# Patient Record
Sex: Female | Born: 1990 | Race: White | Hispanic: No | Marital: Single | Smoking: Never smoker
Health system: Southern US, Community
[De-identification: ages and names within clinical notes are randomized; demographics above are authoritative.]

## PROBLEM LIST (undated history)

## (undated) ENCOUNTER — Inpatient Hospital Stay (HOSPITAL_COMMUNITY): Payer: Self-pay

## (undated) DIAGNOSIS — T8859XA Other complications of anesthesia, initial encounter: Secondary | ICD-10-CM

## (undated) DIAGNOSIS — F444 Conversion disorder with motor symptom or deficit: Secondary | ICD-10-CM

## (undated) DIAGNOSIS — E039 Hypothyroidism, unspecified: Secondary | ICD-10-CM

## (undated) DIAGNOSIS — F419 Anxiety disorder, unspecified: Secondary | ICD-10-CM

## (undated) DIAGNOSIS — Z20828 Contact with and (suspected) exposure to other viral communicable diseases: Secondary | ICD-10-CM

## (undated) DIAGNOSIS — J45909 Unspecified asthma, uncomplicated: Secondary | ICD-10-CM

## (undated) DIAGNOSIS — Z789 Other specified health status: Secondary | ICD-10-CM

## (undated) DIAGNOSIS — E079 Disorder of thyroid, unspecified: Secondary | ICD-10-CM

## (undated) DIAGNOSIS — L309 Dermatitis, unspecified: Secondary | ICD-10-CM

## (undated) HISTORY — PX: WISDOM TOOTH EXTRACTION: SHX21

## (undated) HISTORY — PX: RHINOPLASTY: SUR1284

## (undated) HISTORY — DX: Hypothyroidism, unspecified: E03.9

## (undated) HISTORY — PX: OTHER SURGICAL HISTORY: SHX169

## (undated) HISTORY — DX: Dermatitis, unspecified: L30.9

## (undated) HISTORY — DX: Anxiety disorder, unspecified: F41.9

## (undated) HISTORY — PX: FRACTURE SURGERY: SHX138

## (undated) HISTORY — PX: TONSILLECTOMY: SUR1361

## (undated) HISTORY — DX: Other complications of anesthesia, initial encounter: T88.59XA

## (undated) HISTORY — DX: Conversion disorder with motor symptom or deficit: F44.4

---
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
## 2012-04-26 LAB — OB RESULTS CONSOLE ABO/RH

## 2012-04-26 LAB — OB RESULTS CONSOLE HIV ANTIBODY (ROUTINE TESTING): HIV: NONREACTIVE

## 2012-04-26 LAB — OB RESULTS CONSOLE GC/CHLAMYDIA: Gonorrhea: NEGATIVE

## 2012-08-23 NOTE — L&D Delivery Note (Signed)
Delivery Note Pt progressed to complete and pushed well.  At 4:38 PM a viable female was delivered via Vaginal, Spontaneous Delivery (Presentation: Middle Occiput Anterior).  APGAR: 8, 9; weight pending.   Placenta status: Intact, Spontaneous.  Cord: 3 vessels with the following complications: None.  Anesthesia: Epidural  Episiotomy: None Lacerations: 1st degree;Vaginal Suture Repair: 3.0 vicryl rapide Est. Blood Loss (mL): 300  Mom to postpartum.  Baby to stay with mom.  Sherlyn Ebbert D 11/25/2012, 5:27 PM

## 2012-08-29 ENCOUNTER — Encounter (HOSPITAL_COMMUNITY): Payer: Self-pay

## 2012-08-29 ENCOUNTER — Inpatient Hospital Stay (HOSPITAL_COMMUNITY)
Admission: AD | Admit: 2012-08-29 | Discharge: 2012-08-29 | Disposition: A | Discharge status: Home / Self Care | Payer: Medicaid Other | Source: Ambulatory Visit | Attending: Obstetrics and Gynecology | Admitting: Obstetrics and Gynecology

## 2012-08-29 DIAGNOSIS — W19XXXA Unspecified fall, initial encounter: Secondary | ICD-10-CM

## 2012-08-29 DIAGNOSIS — O99891 Other specified diseases and conditions complicating pregnancy: Secondary | ICD-10-CM | POA: Insufficient documentation

## 2012-08-29 DIAGNOSIS — W1809XA Striking against other object with subsequent fall, initial encounter: Secondary | ICD-10-CM | POA: Insufficient documentation

## 2012-08-29 DIAGNOSIS — M25569 Pain in unspecified knee: Secondary | ICD-10-CM | POA: Insufficient documentation

## 2012-08-29 HISTORY — DX: Disorder of thyroid, unspecified: E07.9

## 2012-08-29 MED ORDER — ACETAMINOPHEN 500 MG PO TABS
1000.0000 mg | ORAL_TABLET | Freq: Once | ORAL | Status: AC
  Administered 2012-08-29: 1000 mg via ORAL
  Filled 2012-08-29: qty 2

## 2012-08-29 NOTE — MAU Provider Note (Signed)
Chief Complaint:  Fall and Knee Pain   First Provider Initiated Contact with Patient 08/29/12 1309      HPI: Sandra Rich is a 22 y.o. G1P0 at [redacted]w[redacted]d who presents to maternity admissions reporting knee pain following a fall at work earlier today. The patientstates that she fell onto her right knee while at work today. She denies any injury to her abdomen. She denies contractions, leakage of fluid or vaginal bleeding. Reports good fetal movement.   Past Medical History: Past Medical History  Diagnosis Date  . 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  . Thyroid disease     Past obstetric history: OB History    Grav Para Term Preterm Abortions TAB SAB Ect Mult Living   1              # Outc Date GA Lbr Len/2nd Wgt Sex Del Anes PTL Lv   1 CUR               Past Surgical History: Past Surgical History  Procedure Date  . Rhinoplasty   . Wisdom teeth removal   . Fracture surgery     Family History: History reviewed. No pertinent family history.  Social History: History  Substance Use Topics  . Smoking status: Never Smoker   . Smokeless tobacco: Never Used  . Alcohol Use: No     Comment: occ    Allergies: No Known Allergies  Meds:  Prescriptions prior to admission  Medication Sig Dispense Refill  . ranitidine (ZANTAC) 75 MG tablet Take 75 mg by mouth daily as needed. For heartburn        ROS: Pertinent findings in history of present illness.  Physical Exam  Blood pressure 123/79, pulse 114, temperature 97.8 F (36.6 C), temperature source Oral, resp. rate 16, height 5' (1.524 m), weight 120 lb 4 oz (54.545 kg), last menstrual period 09/23/2011. GENERAL: Well-developed, well-nourished female in no acute distress.  HEENT: normocephalic HEART: normal rate RESP: normal effort ABDOMEN: Soft, non-tender, gravid appropriate for gestational age EXTREMITIES: right knee has 3 cm x 3 cm surface abrasion, light pink. No bleeding, debris noted. Tenderness to palpation over the  suprapatellar and patellar tendon region. No edema, or erythema.  NEURO: alert and oriented    FHT:  Baseline 140 , moderate variability, accelerations present, no decelerations Contractions: none   Labs: No results found for this or any previous visit (from the past 24 hour(s)).   MAU Course: Discussed patient with Dr. Ellyn Hack. She feels that since the patient did not hit the abdomen in the fall and is not having contractions, VB of LOF. We can monitor x 30 minutes and discharge with recommendations for pain control for her knee that are acceptable in pregnancy  Assessment: 1. Knee pain   2. Fall     Plan: Discharge home Labor precautions discussed RICE for knee pain. Patient given letter for work for today and tomorrow.  Patient to follow-up with [facility] OB[facility] as scheduled.  Patient may return to MAU as needed      Follow-up Information    Follow up with [facility],TODD D, MD. (As scheduled)    Contact information:   [address] [place] [address] [phone]           Medication List     As of 08/29/2012  2:31 PM    TAKE these medications         ranitidine 75 MG tablet  Commonly known as: ZANTAC   Take 75 mg by mouth daily as needed. For heartburn         Freddi Starr, PA-C 08/29/2012 2:31 PM

## 2012-08-29 NOTE — MAU Note (Signed)
Patient is in with c/o sore right knee. She states that her foot got caught on a cart at work about 12pm and feel onto her knees. She denies any abdominal pain, vaginal bleeding or lof. She reports good fetal movement. There is a 1.5cm reddened scratch on her right knee but no bruises noted. Her abdomen is soft and non tender.

## 2012-10-25 LAB — OB RESULTS CONSOLE GBS: GBS: NEGATIVE

## 2012-10-26 ENCOUNTER — Encounter (HOSPITAL_COMMUNITY): Payer: Self-pay | Admitting: *Deleted

## 2012-10-26 ENCOUNTER — Inpatient Hospital Stay (HOSPITAL_COMMUNITY)
Admission: AD | Admit: 2012-10-26 | Discharge: 2012-10-26 | Disposition: A | Discharge status: Home / Self Care | Payer: Medicaid Other | Source: Ambulatory Visit | Attending: Obstetrics and Gynecology | Admitting: Obstetrics and Gynecology

## 2012-10-26 DIAGNOSIS — E860 Dehydration: Secondary | ICD-10-CM | POA: Insufficient documentation

## 2012-10-26 DIAGNOSIS — A084 Viral intestinal infection, unspecified: Secondary | ICD-10-CM

## 2012-10-26 DIAGNOSIS — R197 Diarrhea, unspecified: Secondary | ICD-10-CM | POA: Insufficient documentation

## 2012-10-26 DIAGNOSIS — O212 Late vomiting of pregnancy: Secondary | ICD-10-CM | POA: Insufficient documentation

## 2012-10-26 DIAGNOSIS — A088 Other specified intestinal infections: Secondary | ICD-10-CM

## 2012-10-26 DIAGNOSIS — O99891 Other specified diseases and conditions complicating pregnancy: Secondary | ICD-10-CM | POA: Insufficient documentation

## 2012-10-26 HISTORY — DX: Other specified health status: Z78.9

## 2012-10-26 LAB — URINALYSIS, ROUTINE W REFLEX MICROSCOPIC
Bilirubin Urine: NEGATIVE
Glucose, UA: NEGATIVE mg/dL
Ketones, ur: >80 mg/dL — AB
Nitrite: NEGATIVE
Protein, ur: NEGATIVE mg/dL
Urobilinogen, UA: 0.2 mg/dL (ref 0.0–1.0)
pH: 5.5 (ref 5.0–8.0)

## 2012-10-26 LAB — URINE MICROSCOPIC-ADD ON

## 2012-10-26 LAB — COMPREHENSIVE METABOLIC PANEL
Alkaline Phosphatase: 264 U/L — ABNORMAL HIGH (ref 39–117)
BUN: 6 mg/dL (ref 6–23)
CO2: 21 mEq/L (ref 19–32)
Chloride: 100 mEq/L (ref 96–112)
GFR calc Af Amer: >90 mL/min (ref >90)
GFR calc non Af Amer: >90 mL/min (ref >90)
Glucose, Bld: 87 mg/dL (ref 70–99)
Potassium: 3.8 mEq/L (ref 3.5–5.1)
Total Bilirubin: 0.5 mg/dL (ref 0.3–1.2)

## 2012-10-26 LAB — CBC WITH DIFFERENTIAL/PLATELET
Basophils Absolute: 0 10*3/uL (ref 0.0–0.1)
Basophils Relative: 0 % (ref 0–1)
Lymphocytes Relative: 3 % — ABNORMAL LOW (ref 12–46)
Neutro Abs: 17.5 10*3/uL — ABNORMAL HIGH (ref 1.7–7.7)
Platelets: 286 10*3/uL (ref 150–400)
RDW: 15.2 % (ref 11.5–15.5)
WBC: 19.2 10*3/uL — ABNORMAL HIGH (ref 4.0–10.5)

## 2012-10-26 MED ORDER — PROMETHAZINE HCL 25 MG PO TABS: 12.5000 mg | ORAL_TABLET | Freq: Four times a day (QID) | ORAL | Status: DC | PRN

## 2012-10-26 MED ORDER — SODIUM CHLORIDE 0.9 % IV SOLN
25.0000 mg | Freq: Once | INTRAVENOUS | Status: AC
  Administered 2012-10-26: 25 mg via INTRAVENOUS
  Filled 2012-10-26: qty 1

## 2012-10-26 MED ORDER — LACTATED RINGERS IV SOLN: INTRAVENOUS | Status: DC

## 2012-10-26 NOTE — MAU Provider Note (Signed)
History     CSN: [id number]  Arrival date and time: 10/26/12 1851   First Provider Initiated Contact with Patient 10/26/12 1938      No chief complaint on file.  HPI This is a 22 y.o. G1P0 at [redacted]w[redacted]d who presents with history of vomiting since 1130 and development of diarrhea later. Husband was sick with this yesterday.  Denies fever or bleeding. Good fetal movement.   RN Note: PT SAYS AT 1130 AM THIS AM - SHE STARTED VOMITING- CALLED [facility]- THEY CALLED IN PHENERGAN SUPP- LAST USED AT 230PM. THEN SHE GOT DIARRHEA- THEN SHE WENT TO SLEEP- THEN AWOKE AND VOMITED AGAIN. SAYS UC STARTED AT 6PM. VE IN OFFICE -CLOSED. DENIES HSV AND MRSA.      OB History   Grav Para Term Preterm Abortions TAB SAB Ect Mult Living   1               Past Medical History  Diagnosis Date  . 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  . Thyroid disease   . Medical history non-contributory     Past Surgical History  Procedure Laterality Date  . Rhinoplasty    . Wisdom teeth removal    . Fracture surgery      History reviewed. No pertinent family history.  History  Substance Use Topics  . Smoking status: Never Smoker   . Smokeless tobacco: Never Used  . Alcohol Use: No     Comment: occ    Allergies: No Known Allergies  Prescriptions prior to admission  Medication Sig Dispense Refill  . acetaminophen (TYLENOL) 500 MG tablet Take 500 mg by mouth every 6 (six) hours as needed for pain.      . promethazine (PHENERGAN) 25 MG suppository Place 25 mg rectally every 6 (six) hours as needed for nausea.        Review of Systems  Constitutional: Positive for malaise/fatigue. Negative for fever and chills.  Gastrointestinal: Positive for nausea, vomiting, abdominal pain and diarrhea. Negative for constipation.  Genitourinary: Negative for dysuria.  Neurological: Positive for weakness. Negative for headaches.   Physical Exam   Blood pressure 122/85, pulse 121, temperature 98.6 F (37 C), temperature  source Oral, resp. rate 20, height 5\' 1"  (1.549 m), weight 128 lb 2 oz (58.117 kg), last menstrual period 09/23/2011.  Physical Exam  Constitutional: She is oriented to person, place, and time. She appears well-developed. No distress.  HENT:  Head: Normocephalic.  Cardiovascular: Regular rhythm and normal heart sounds.   Slight tachycardia  Respiratory: Effort normal and breath sounds normal. No respiratory distress. She has no wheezes. She has no rales. She exhibits no tenderness.  GI: Soft. She exhibits no distension and no mass. There is tenderness (diffuse, mild). There is no rebound and no guarding.  Musculoskeletal: Normal range of motion.  Neurological: She is alert and oriented to person, place, and time.  Skin: Skin is warm and dry.  Psychiatric: She has a normal mood and affect.    MAU Course  Procedures  MDM Will give IV with Phenergan then one more liter of LR 20:00 Reported MSE and orders that have begun for IV hydration.  Order given for CBC/diff and CMP.   20:30  Care turned over to [facility] and Plan  A:  SIUP at [redacted]w[redacted]d        Probable Norovirus P:  Hydration  Report to Lynder Parents, FNP  [facility] 10/26/2012, 7:45 PM   Care assumed from Jeani Sow, NP, at 2000.  FHR baseline 160s for brief period, and MHR 120s.  After 2000 ml IV fluid, FHR baseline 145 with accels, no decels, moderate variability Ctx Q2-3 minutes on toco, palpate mild, pt reports decrease in cramping while in MAU  Dilation: 1 Effacement (%): 60 Cervical Position: Posterior Station: -3 Presentation: Vertex Exam by:: L Leftwich-Kirby CNM A: 1. Viral gastroenteritis   2. Mild dehydration   Cervix unchanged/mild change in >2 hours in MAU  P: Called Dr Ambrose Mantle to discuss assessment and findings D/C home Labor precautions given Encouraged PO fluids PO phenergan prescribed--pt does not want to use suppositories she has at home F/U with prenatal  provider Return to MAU as needed  Sharen Counter Certified Nurse-Midwife

## 2012-10-26 NOTE — MAU Note (Signed)
PT SAYS  AT 1130 AM THIS AM  - SHE STARTED VOMITING- CALLED [facility]-  THEY CALLED IN  PHENERGAN SUPP-  LAST USED AT 230PM.   THEN SHE GOT DIARRHEA- THEN SHE WENT TO SLEEP- THEN AWOKE  AND VOMITED AGAIN. SAYS UC STARTED AT  6PM.     VE IN OFFICE -CLOSED.   DENIES HSV AND MRSA.

## 2012-10-28 LAB — URINE CULTURE: Colony Count: NO GROWTH

## 2012-11-23 ENCOUNTER — Telehealth (HOSPITAL_COMMUNITY): Payer: Self-pay | Admitting: *Deleted

## 2012-11-23 ENCOUNTER — Encounter (HOSPITAL_COMMUNITY): Payer: Self-pay | Admitting: *Deleted

## 2012-11-23 NOTE — Telephone Encounter (Signed)
Preadmission screen  

## 2012-11-25 ENCOUNTER — Encounter (HOSPITAL_COMMUNITY): Payer: Self-pay | Admitting: Anesthesiology

## 2012-11-25 ENCOUNTER — Inpatient Hospital Stay (HOSPITAL_COMMUNITY)
Admission: AD | Admit: 2012-11-25 | Discharge: 2012-11-27 | DRG: 775 | Disposition: A | Discharge status: Home / Self Care | Payer: Medicaid Other | Source: Ambulatory Visit | Attending: Obstetrics and Gynecology | Admitting: Obstetrics and Gynecology

## 2012-11-25 ENCOUNTER — Encounter (HOSPITAL_COMMUNITY): Payer: Self-pay | Admitting: *Deleted

## 2012-11-25 ENCOUNTER — Inpatient Hospital Stay (HOSPITAL_COMMUNITY): Payer: Medicaid Other | Admitting: Anesthesiology

## 2012-11-25 DIAGNOSIS — IMO0001 Reserved for inherently not codable concepts without codable children: Secondary | ICD-10-CM

## 2012-11-25 LAB — RPR: RPR Ser Ql: NONREACTIVE

## 2012-11-25 LAB — TYPE AND SCREEN
ABO/RH(D): B POS
Antibody Screen: NEGATIVE

## 2012-11-25 LAB — CBC
MCV: 68.4 fL — ABNORMAL LOW (ref 78.0–100.0)
Platelets: 300 10*3/uL (ref 150–400)
RBC: 4.14 MIL/uL (ref 3.87–5.11)
WBC: 17.4 10*3/uL — ABNORMAL HIGH (ref 4.0–10.5)

## 2012-11-25 MED ORDER — CITRIC ACID-SODIUM CITRATE 334-500 MG/5ML PO SOLN: 30.0000 mL | ORAL | Status: DC | PRN

## 2012-11-25 MED ORDER — SIMETHICONE 80 MG PO CHEW: 80.0000 mg | CHEWABLE_TABLET | ORAL | Status: DC | PRN

## 2012-11-25 MED ORDER — OXYTOCIN 40 UNITS IN LACTATED RINGERS INFUSION - SIMPLE MED
62.5000 mL/h | INTRAVENOUS | Status: DC
  Filled 2012-11-25: qty 1000

## 2012-11-25 MED ORDER — MAGNESIUM HYDROXIDE 400 MG/5ML PO SUSP: 30.0000 mL | ORAL | Status: DC | PRN

## 2012-11-25 MED ORDER — BUTORPHANOL TARTRATE 1 MG/ML IJ SOLN
1.0000 mg | INTRAMUSCULAR | Status: DC | PRN
  Filled 2012-11-25: qty 1

## 2012-11-25 MED ORDER — OXYCODONE-ACETAMINOPHEN 5-325 MG PO TABS
1.0000 | ORAL_TABLET | ORAL | Status: DC | PRN
  Administered 2012-11-26 – 2012-11-27 (×2): 1 via ORAL
  Administered 2012-11-27: 2 via ORAL
  Filled 2012-11-25 (×2): qty 1
  Filled 2012-11-25: qty 2

## 2012-11-25 MED ORDER — FENTANYL 2.5 MCG/ML BUPIVACAINE 1/10 % EPIDURAL INFUSION (WH - ANES)
14.0000 mL/h | INTRAMUSCULAR | Status: DC | PRN
  Administered 2012-11-25 (×2): 14 mL/h via EPIDURAL
  Filled 2012-11-25 (×3): qty 125

## 2012-11-25 MED ORDER — METHYLERGONOVINE MALEATE 0.2 MG PO TABS: 0.2000 mg | ORAL_TABLET | ORAL | Status: DC | PRN

## 2012-11-25 MED ORDER — OXYTOCIN 40 UNITS IN LACTATED RINGERS INFUSION - SIMPLE MED
1.0000 m[IU]/min | INTRAVENOUS | Status: DC
  Administered 2012-11-25: 1 m[IU]/min via INTRAVENOUS

## 2012-11-25 MED ORDER — ONDANSETRON HCL 4 MG PO TABS: 4.0000 mg | ORAL_TABLET | ORAL | Status: DC | PRN

## 2012-11-25 MED ORDER — EPHEDRINE 5 MG/ML INJ
10.0000 mg | INTRAVENOUS | Status: DC | PRN
  Filled 2012-11-25: qty 2

## 2012-11-25 MED ORDER — OXYTOCIN BOLUS FROM INFUSION
500.0000 mL | INTRAVENOUS | Status: DC
  Administered 2012-11-25: 500 mL via INTRAVENOUS

## 2012-11-25 MED ORDER — SENNOSIDES-DOCUSATE SODIUM 8.6-50 MG PO TABS
2.0000 | ORAL_TABLET | Freq: Every day | ORAL | Status: DC
  Administered 2012-11-25 – 2012-11-26 (×2): 2 via ORAL

## 2012-11-25 MED ORDER — MEASLES, MUMPS & RUBELLA VAC ~~LOC~~ INJ: 0.5000 mL | INJECTION | Freq: Once | SUBCUTANEOUS | Status: DC

## 2012-11-25 MED ORDER — DIPHENHYDRAMINE HCL 50 MG/ML IJ SOLN
12.5000 mg | INTRAMUSCULAR | Status: DC | PRN
  Administered 2012-11-25: 12.5 mg via INTRAVENOUS
  Filled 2012-11-25: qty 1

## 2012-11-25 MED ORDER — TETANUS-DIPHTH-ACELL PERTUSSIS 5-2.5-18.5 LF-MCG/0.5 IM SUSP: 0.5000 mL | Freq: Once | INTRAMUSCULAR | Status: DC

## 2012-11-25 MED ORDER — PRENATAL MULTIVITAMIN CH
1.0000 | ORAL_TABLET | Freq: Every day | ORAL | Status: DC
  Administered 2012-11-26 – 2012-11-27 (×2): 1 via ORAL
  Filled 2012-11-25 (×2): qty 1

## 2012-11-25 MED ORDER — BENZOCAINE-MENTHOL 20-0.5 % EX AERO
1.0000 "application " | INHALATION_SPRAY | CUTANEOUS | Status: DC | PRN
  Administered 2012-11-27: 1 via TOPICAL
  Filled 2012-11-25: qty 56

## 2012-11-25 MED ORDER — LIDOCAINE HCL (PF) 1 % IJ SOLN
30.0000 mL | INTRAMUSCULAR | Status: DC | PRN
  Filled 2012-11-25 (×2): qty 30

## 2012-11-25 MED ORDER — IBUPROFEN 600 MG PO TABS: 600.0000 mg | ORAL_TABLET | Freq: Four times a day (QID) | ORAL | Status: DC | PRN

## 2012-11-25 MED ORDER — WITCH HAZEL-GLYCERIN EX PADS: 1.0000 "application " | MEDICATED_PAD | CUTANEOUS | Status: DC | PRN

## 2012-11-25 MED ORDER — ACETAMINOPHEN 325 MG PO TABS: 650.0000 mg | ORAL_TABLET | ORAL | Status: DC | PRN

## 2012-11-25 MED ORDER — OXYCODONE-ACETAMINOPHEN 5-325 MG PO TABS: 1.0000 | ORAL_TABLET | ORAL | Status: DC | PRN

## 2012-11-25 MED ORDER — FLEET ENEMA 7-19 GM/118ML RE ENEM: 1.0000 | ENEMA | RECTAL | Status: DC | PRN

## 2012-11-25 MED ORDER — LACTATED RINGERS IV SOLN: 500.0000 mL | INTRAVENOUS | Status: DC | PRN

## 2012-11-25 MED ORDER — PHENYLEPHRINE 40 MCG/ML (10ML) SYRINGE FOR IV PUSH (FOR BLOOD PRESSURE SUPPORT)
80.0000 ug | PREFILLED_SYRINGE | INTRAVENOUS | Status: DC | PRN
  Filled 2012-11-25: qty 2
  Filled 2012-11-25 (×2): qty 5

## 2012-11-25 MED ORDER — METHYLERGONOVINE MALEATE 0.2 MG/ML IJ SOLN: 0.2000 mg | INTRAMUSCULAR | Status: DC | PRN

## 2012-11-25 MED ORDER — LANOLIN HYDROUS EX OINT: TOPICAL_OINTMENT | CUTANEOUS | Status: DC | PRN

## 2012-11-25 MED ORDER — PROMETHAZINE HCL 25 MG/ML IJ SOLN
12.5000 mg | Freq: Once | INTRAMUSCULAR | Status: DC
  Filled 2012-11-25: qty 1

## 2012-11-25 MED ORDER — LACTATED RINGERS IV SOLN
INTRAVENOUS | Status: DC
  Administered 2012-11-25: 125 mL/h via INTRAVENOUS
  Administered 2012-11-25: 04:00:00 via INTRAVENOUS

## 2012-11-25 MED ORDER — EPHEDRINE 5 MG/ML INJ
10.0000 mg | INTRAVENOUS | Status: DC | PRN
  Filled 2012-11-25 (×2): qty 4
  Filled 2012-11-25: qty 2

## 2012-11-25 MED ORDER — TERBUTALINE SULFATE 1 MG/ML IJ SOLN: 0.2500 mg | Freq: Once | INTRAMUSCULAR | Status: DC | PRN

## 2012-11-25 MED ORDER — PHENYLEPHRINE 40 MCG/ML (10ML) SYRINGE FOR IV PUSH (FOR BLOOD PRESSURE SUPPORT)
80.0000 ug | PREFILLED_SYRINGE | INTRAVENOUS | Status: DC | PRN
  Filled 2012-11-25: qty 2

## 2012-11-25 MED ORDER — LIDOCAINE HCL (PF) 1 % IJ SOLN
INTRAMUSCULAR | Status: DC | PRN
  Administered 2012-11-25 (×4): 4 mL

## 2012-11-25 MED ORDER — DIPHENHYDRAMINE HCL 25 MG PO CAPS: 25.0000 mg | ORAL_CAPSULE | Freq: Four times a day (QID) | ORAL | Status: DC | PRN

## 2012-11-25 MED ORDER — IBUPROFEN 600 MG PO TABS
600.0000 mg | ORAL_TABLET | Freq: Four times a day (QID) | ORAL | Status: DC
  Administered 2012-11-25 – 2012-11-27 (×7): 600 mg via ORAL
  Filled 2012-11-25 (×7): qty 1

## 2012-11-25 MED ORDER — LACTATED RINGERS IV SOLN
500.0000 mL | Freq: Once | INTRAVENOUS | Status: AC
  Administered 2012-11-25: 500 mL via INTRAVENOUS

## 2012-11-25 MED ORDER — ONDANSETRON HCL 4 MG/2ML IJ SOLN
4.0000 mg | Freq: Four times a day (QID) | INTRAMUSCULAR | Status: DC | PRN
  Administered 2012-11-25: 4 mg via INTRAVENOUS
  Filled 2012-11-25: qty 2

## 2012-11-25 MED ORDER — DIBUCAINE 1 % RE OINT: 1.0000 "application " | TOPICAL_OINTMENT | RECTAL | Status: DC | PRN

## 2012-11-25 MED ORDER — ONDANSETRON HCL 4 MG/2ML IJ SOLN: 4.0000 mg | INTRAMUSCULAR | Status: DC | PRN

## 2012-11-25 MED ORDER — ZOLPIDEM TARTRATE 5 MG PO TABS: 5.0000 mg | ORAL_TABLET | Freq: Every evening | ORAL | Status: DC | PRN

## 2012-11-25 NOTE — Anesthesia Procedure Notes (Signed)
Epidural Patient location during procedure: OB Start time: 11/25/2012 4:48 AM  Staffing Performed by: anesthesiologist   Preanesthetic Checklist Completed: patient identified, site marked, surgical consent, pre-op evaluation, timeout performed, IV checked, risks and benefits discussed and monitors and equipment checked  Epidural Patient position: sitting Prep: site prepped and draped and DuraPrep Patient monitoring: continuous pulse ox and blood pressure Approach: midline Injection technique: LOR air  Needle:  Needle type: Tuohy  Needle gauge: 17 G Needle length: 9 cm and 9 Needle insertion depth: 4 cm Catheter type: closed end flexible Catheter size: 19 Gauge Catheter at skin depth: 9 cm Test dose: negative  Assessment Events: blood not aspirated, injection not painful, no injection resistance, negative IV test and no paresthesia  Additional Notes Discussed risk of headache, infection, bleeding, nerve injury and failed or incomplete block.  Patient voices understanding and wishes to proceed.  Epidural placed easily on first attempt.  No paresthesia.  Patient tolerated procedure well with no apparent complications.  Jasmine December, MDReason for block:procedure for pain

## 2012-11-25 NOTE — Progress Notes (Signed)
Pt moving toco after rn adjusted toco

## 2012-11-25 NOTE — Progress Notes (Signed)
Dr Jackelyn Knife asked pt to not push until his other pt delivers

## 2012-11-25 NOTE — H&P (Signed)
Sandra Rich is a 21 y.o. female, G1P0, EGA [redacted] weeks with EDC 4-3 presenting for evaluation of reg ctx.  VE in MAU 4 cm dilated.  Pt has been admitted and received an epidural.  Prenatal care essentially uncomplicated, occasional FHR irregularity, see prenatal records for complete history.  Maternal Medical History:  Reason for admission: Contractions.   Contractions: Frequency: regular.   Perceived severity is strong.    Fetal activity: Perceived fetal activity is normal.    Prenatal complications: no prenatal complications   OB History   Grav Para Term Preterm Abortions TAB SAB Ect Mult Living   1              Past Medical History  Diagnosis Date  . 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  . Thyroid disease     hypo  . Medical history non-contributory   . Eczema    Past Surgical History  Procedure Laterality Date  . Rhinoplasty    . Wisdom teeth removal    . Fracture surgery     Family History: family history includes Depression in her brother. Social History:  reports that she has never smoked. She has never used smokeless tobacco. She reports that she does not drink alcohol or use illicit drugs.   Prenatal Transfer Tool  Maternal Diabetes: No Genetic Screening: Normal Maternal Ultrasounds/Referrals: Normal Fetal Ultrasounds or other Referrals:  None Maternal Substance Abuse:  No Significant Maternal Medications:  None Significant Maternal Lab Results:  Lab values include: Group B Strep negative Other Comments:  Occasional FHR irregularity  Review of Systems  Respiratory: Negative.   Cardiovascular: Negative.    AROM- clear Dilation: 6 Effacement (%): 90 Station: 0 Exam by:: Dr. Jackelyn Knife Blood pressure 113/77, pulse 110, temperature 98.2 F (36.8 C), temperature source Oral, resp. rate 18, height 5\' 1"  (1.549 m), weight 58.514 kg (129 lb), last menstrual period 09/23/2011, SpO2 100.00%. Maternal Exam:  Uterine Assessment: Contraction strength is moderate.   Contraction frequency is regular.   Abdomen: Patient reports no abdominal tenderness. Estimated fetal weight is 7 lbs.   Fetal presentation: vertex  Introitus: Normal vulva. Normal vagina.  Amniotic fluid character: clear.  Pelvis: adequate for delivery.   Cervix: Cervix evaluated by digital exam.     Fetal Exam Fetal Monitor Review: Mode: ultrasound.   Baseline rate: 120.  Variability: moderate (6-25 bpm).   Pattern: accelerations present and no decelerations.    Fetal State Assessment: Category I - tracings are normal.     Physical Exam  Constitutional: She appears well-developed and well-nourished.  Cardiovascular: Normal rate, regular rhythm and normal heart sounds.   No murmur heard. Respiratory: Effort normal and breath sounds normal. No respiratory distress.  GI: Soft.  gravid  Genitourinary:  Probable condylomata on labia    Prenatal labs: ABO, Rh: --/--/B POS (04/05 0340) Antibody: PENDING (04/05 0340) Rubella: Immune (09/04 0000) RPR: Nonreactive (09/04 0000)  HBsAg: Negative (09/04 0000)  HIV: Non-reactive (09/04 0000)  GBS: Negative (03/05 0000)  GCT:  118  Assessment/Plan: IUP at 40+ weeks in active labor.  AROM done for augmentation, monitor progress anticipate SVD.   Sandra Rich D 11/25/2012, 5:36 AM

## 2012-11-25 NOTE — Progress Notes (Signed)
Pt had turned to back and repositioned on left side

## 2012-11-25 NOTE — Anesthesia Preprocedure Evaluation (Signed)
Anesthesia Evaluation  Patient identified by MRN, date of birth, ID band Patient awake    Reviewed: Allergy & Precautions, H&P , NPO status , Patient's Chart, lab work & pertinent test results, reviewed documented beta blocker date and time   History of Anesthesia Complications Negative for: history of anesthetic complications  Airway Mallampati: I TM Distance: >3 FB Neck ROM: full    Dental  (+) Teeth Intact   Pulmonary neg pulmonary ROS,  breath sounds clear to auscultation        Cardiovascular negative cardio ROS  Rhythm:regular Rate:Normal     Neuro/Psych negative neurological ROS  negative psych ROS   GI/Hepatic negative GI ROS, Neg liver ROS,   Endo/Other  negative endocrine ROS  Renal/GU negative Renal ROS     Musculoskeletal   Abdominal   Peds  Hematology negative hematology ROS (+) anemia ,   Anesthesia Other Findings Morphine allergy - hives  Reproductive/Obstetrics (+) Pregnancy                           Anesthesia Physical Anesthesia Plan  ASA: II  Anesthesia Plan: Epidural   Post-op Pain Management:    Induction:   Airway Management Planned:   Additional Equipment:   Intra-op Plan:   Post-operative Plan:   Informed Consent: I have reviewed the patients History and Physical, chart, labs and discussed the procedure including the risks, benefits and alternatives for the proposed anesthesia with the patient or authorized representative who has indicated his/her understanding and acceptance.     Plan Discussed with:   Anesthesia Plan Comments:         Anesthesia Quick Evaluation

## 2012-11-26 LAB — CBC
HCT: 24.6 % — ABNORMAL LOW (ref 36.0–46.0)
Hemoglobin: 7.6 g/dL — ABNORMAL LOW (ref 12.0–15.0)
MCH: 21.2 pg — ABNORMAL LOW (ref 26.0–34.0)
MCHC: 30.9 g/dL (ref 30.0–36.0)
MCV: 68.5 fL — ABNORMAL LOW (ref 78.0–100.0)

## 2012-11-26 NOTE — Progress Notes (Addendum)
PPD #1 No problems Afeb, VSS Fundus firm, NT at U-1 Hgb 8.7 to 7.6 Continue routine postpartum care

## 2012-11-26 NOTE — Lactation Note (Signed)
This note was copied from the chart of Sandra Rich. Lactation Consultation Note  Patient Name: Sandra Deshonna Trnka FAOZH'Y Date: 11/26/2012 Reason for consult: Initial assessment LC in to assist mother with feeding . Baby has been latching shallow and causing discomfort to mother 's nipples. Taught suck training exercises and gum massage. Baby was able to latch with minimal assistance to the right breast in a cross cradle hold. Reviewed latch and postioning, feeding cues and listening for swallows. Mother was engaged with LC at times. She was talking to her friend and mother regarding social situation with FOB and she would frequently talk about being so sleepy after pain med. Teaching will need to be reinforced. Taught hand expression and mother has lots of colostrum. [facility] handout given with resources for support after discharge. Patients mother is very supportive of breastfeeding. Patient was having uterine cramping during the feeding.  Maternal Data Formula Feeding for Exclusion: No Infant to breast within first hour of birth: Yes Has patient been taught Hand Expression?: Yes Does the patient have breastfeeding experience prior to this delivery?: No  Feeding Feeding Type: Breast Milk Feeding method: Breast Length of feed: 5 min  LATCH Score/Interventions Latch: Repeated attempts needed to sustain latch, nipple held in mouth throughout feeding, stimulation needed to elicit sucking reflex. Intervention(s): Assist with latch;Breast compression  Audible Swallowing: A few with stimulation Intervention(s): Skin to skin  Type of Nipple: Everted at rest and after stimulation  Comfort (Breast/Nipple): Soft / non-tender     Hold (Positioning): Assistance needed to correctly position infant at breast and maintain latch. Intervention(s): Breastfeeding basics reviewed;Skin to skin;Support Pillows  LATCH Score: 7  Lactation Tools Discussed/Used     Consult Status Consult  Status: Follow-up Date: 11/27/12 Follow-up type: In-patient    Christella Hartigan M 11/26/2012, 6:49 PM

## 2012-11-26 NOTE — Anesthesia Postprocedure Evaluation (Signed)
  Anesthesia Post-op Note  Patient: Sandra Rich  Procedure(s) Performed: * No procedures listed *  Patient Location: Mother/Baby  Anesthesia Type:Epidural  Level of Consciousness: awake, alert , oriented and patient cooperative  Airway and Oxygen Therapy: Patient Spontanous Breathing  Post-op Pain: mild  Post-op Assessment: Patient's Cardiovascular Status Stable, Respiratory Function Stable, No signs of Nausea or vomiting, Adequate PO intake and Pain level controlled  Post-op Vital Signs: Reviewed and stable  Complications: No apparent anesthesia complications

## 2012-11-27 MED ORDER — OXYCODONE-ACETAMINOPHEN 5-325 MG PO TABS: 1.0000 | ORAL_TABLET | ORAL | Status: DC | PRN

## 2012-11-27 MED ORDER — IBUPROFEN 600 MG PO TABS: 600.0000 mg | ORAL_TABLET | Freq: Four times a day (QID) | ORAL | Status: DC

## 2012-11-27 NOTE — Progress Notes (Signed)
PPD #2 No problems Afeb, VSS Fundus firm, NT at U-1 D/c home 

## 2012-11-27 NOTE — Progress Notes (Signed)
UR chart review completed.  

## 2012-11-27 NOTE — Discharge Summary (Signed)
Obstetric Discharge Summary Reason for Admission: onset of labor Prenatal Procedures: none Intrapartum Procedures: spontaneous vaginal delivery Postpartum Procedures: none Complications-Operative and Postpartum: 1st degree perineal laceration Hemoglobin  Date Value Range Status  11/26/2012 7.6* 12.0 - 15.0 g/dL Final     HCT  Date Value Range Status  11/26/2012 24.6* 36.0 - 46.0 % Final    Physical Exam:  General: alert Lochia: appropriate Uterine Fundus: firm  Discharge Diagnoses: Term Pregnancy-delivered  Discharge Information: Date: 11/27/2012 Activity: pelvic rest Diet: routine Medications: Ibuprofen and Percocet Condition: stable Instructions: refer to practice specific booklet Discharge to: home Follow-up Information   Follow up with Breydan Shillingburg D, MD. Schedule an appointment as soon as possible for a visit in 6 weeks.   Contact information:   [address] [place] [address] [phone]       Newborn Data: Live born female  Birth Weight: 8 lb 3 oz (3714 g) APGAR: 8, 9  Home with mother.  Alek Poncedeleon D 11/27/2012, 10:09 AM

## 2012-11-30 ENCOUNTER — Inpatient Hospital Stay (HOSPITAL_COMMUNITY): Admission: RE | Admit: 2012-11-30 | Payer: Medicaid Other | Source: Ambulatory Visit

## 2014-06-24 ENCOUNTER — Encounter (HOSPITAL_COMMUNITY): Payer: Self-pay | Admitting: *Deleted

## 2015-10-08 ENCOUNTER — Emergency Department (HOSPITAL_COMMUNITY)
Admission: EM | Admit: 2015-10-08 | Discharge: 2015-10-08 | Disposition: A | Discharge status: Home / Self Care | Payer: Medicaid Other | Attending: Emergency Medicine | Admitting: Emergency Medicine

## 2015-10-08 ENCOUNTER — Encounter (HOSPITAL_COMMUNITY): Payer: Self-pay | Admitting: *Deleted

## 2015-10-08 DIAGNOSIS — R0981 Nasal congestion: Secondary | ICD-10-CM

## 2015-10-08 DIAGNOSIS — Z872 Personal history of diseases of the skin and subcutaneous tissue: Secondary | ICD-10-CM | POA: Insufficient documentation

## 2015-10-08 DIAGNOSIS — Z8639 Personal history of other endocrine, nutritional and metabolic disease: Secondary | ICD-10-CM | POA: Insufficient documentation

## 2015-10-08 DIAGNOSIS — J029 Acute pharyngitis, unspecified: Secondary | ICD-10-CM | POA: Insufficient documentation

## 2015-10-08 DIAGNOSIS — Z791 Long term (current) use of non-steroidal anti-inflammatories (NSAID): Secondary | ICD-10-CM | POA: Insufficient documentation

## 2015-10-08 DIAGNOSIS — J3489 Other specified disorders of nose and nasal sinuses: Secondary | ICD-10-CM | POA: Insufficient documentation

## 2015-10-08 MED ORDER — FLUTICASONE PROPIONATE 50 MCG/ACT NA SUSP: 2.0000 | Freq: Every day | NASAL | Status: DC

## 2015-10-08 NOTE — ED Provider Notes (Signed)
CSN: [id number]     Arrival date & time 10/08/15  1622 History  By signing my name below, I, Soijett Blue, attest that this documentation has been prepared under the direction and in the presence of Fayrene Helper, PA-C Electronically Signed: Soijett Blue, ED Scribe. 10/08/2015. 4:54 PM.   Chief Complaint  Patient presents with  . Facial Pain      The history is provided by the patient. No language interpreter was used.    Sandra Rich is a 25 y.o. female who presents to the Emergency Department complaining of sinus pressure onset 5 days ago. She notes that she tried to make an appointment with her PCP with no success. pt denies any environmental changes/foods/soaps at this time. She states that she is having associated symptoms of nasal congestion, chest congestion, chills, sneezing, sore throat x 2 days that has resolved, and fever that has since resolved. She states that she has tried tylenol and alka seltzer cold plus with no relief for her symptoms. She denies n/v/d, and any other symptoms.  Denies PMHx of asthma or any seasonal allergies.   Past Medical History  Diagnosis Date  . 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  . Thyroid disease     hypo  . Medical history non-contributory   . Eczema    Past Surgical History  Procedure Laterality Date  . Rhinoplasty    . Wisdom teeth removal    . Fracture surgery     Family History  Problem Relation Age of Onset  . Depression Brother    Social History  Substance Use Topics  . Smoking status: Never Smoker   . Smokeless tobacco: Never Used  . Alcohol Use: No     Comment: occ   OB History    Gravida Para Term Preterm AB TAB SAB Ectopic Multiple Living   Review of Systems  Constitutional: Positive for fever and chills.  HENT: Positive for congestion, sinus pressure, sneezing and sore throat.   Gastrointestinal: Negative for nausea, vomiting and diarrhea.      Allergies  Morphine and related  Home  Medications   Prior to Admission medications   Medication Sig Start Date End Date Taking? Authorizing Provider  ibuprofen (ADVIL,MOTRIN) 600 MG tablet Take 1 tablet (600 mg total) by mouth every 6 (six) hours. 11/27/12   Lavina Hamman, MD  oxyCODONE-acetaminophen (PERCOCET/ROXICET) 5-325 MG per tablet Take 1-2 tablets by mouth every 4 (four) hours as needed. 11/27/12   Lavina Hamman, MD  promethazine (PHENERGAN) 25 MG tablet Take 0.5-1 tablets (12.5-25 mg total) by mouth every 6 (six) hours as needed for nausea. 10/26/12   Misty Stanley A Leftwich-Kirby, CNM   BP 126/89 mmHg  Pulse 77  Temp(Src) 97.5 F (36.4 C) (Oral)  Resp 14  SpO2 100% Physical Exam   Physical Exam  Constitutional: Pt  is oriented to person, place, and time. Appears well-developed and well-nourished. No distress.  HENT:  Head: Normocephalic and atraumatic.  Right Ear: Tympanic membrane, external ear and ear canal normal.  Left Ear: Tympanic membrane, external ear and ear canal normal.  Nose: Mucosal edema and mild rhinorrhea present. No epistaxis. Pt exhibits mild frontal sinus tenderness and no maxillary sinus tenderness. Left sinus exhibits no maxillary sinus tenderness and no frontal sinus tenderness.  Mouth/Throat: Uvula is midline and mucous membranes are normal. Mucous membranes are not pale and not cyanotic. No oropharyngeal exudate, posterior  oropharyngeal edema, posterior oropharyngeal erythema or tonsillar abscesses.  Eyes: Conjunctivae are normal. Pupils are equal, round, and reactive to light.  Neck: Normal range of motion and full passive range of motion without pain.  Cardiovascular: Normal rate and intact distal pulses.   Pulmonary/Chest: Effort normal and breath sounds normal. No stridor.  Clear and equal breath sounds without focal wheezes, rhonchi, rales  Abdominal: Soft. Bowel sounds are normal. There is no tenderness.  Musculoskeletal: Normal range of motion.  Lymphadenopathy:    Pthas no cervical  adenopathy.  Neurological: Pt is alert and oriented to person, place, and time.  Skin: Skin is warm and dry. No rash noted. Pt is not diaphoretic.  Psychiatric: Normal mood and affect.  Nursing note and vitals reviewed.    ED Course  Procedures (including critical care time) DIAGNOSTIC STUDIES: Oxygen Saturation is 100% on RA, nl by my interpretation.    COORDINATION OF CARE: 4:52 PM Discussed treatment plan with pt at bedside which includes flonase Rx and pt agreed to plan.    MDM   Final diagnoses:  None   I suspect that this is a probable seasonal allergies as the source of the sinus congestion, although common URI presents in similar fashion. Low suspicion for pneumonia, bacterial infection, sinusitis, or strep throat at this time. Will treat the pt with flonase Rx and symptomatic treatment. Discussed return precautions.  Pt is hemodynamically stable & in NAD prior to discharge.  I personally performed the services described in this documentation, which was scribed in my presence. The recorded information has been reviewed and is accurate.      Fayrene Helper, PA-C 10/08/15 1656  Glynn Octave, MD 10/08/15 [phone]

## 2015-10-08 NOTE — Discharge Instructions (Signed)
Allergic Rhinitis Allergic rhinitis is when the mucous membranes in the nose respond to allergens. Allergens are particles in the air that cause your body to have an allergic reaction. This causes you to release allergic antibodies. Through a chain of events, these eventually cause you to release histamine into the blood stream. Although meant to protect the body, it is this release of histamine that causes your discomfort, such as frequent sneezing, congestion, and an itchy, runny nose.  CAUSES Seasonal allergic rhinitis (hay fever) is caused by pollen allergens that may come from grasses, trees, and weeds. Year-round allergic rhinitis (perennial allergic rhinitis) is caused by allergens such as house dust mites, pet dander, and mold spores. SYMPTOMS  Nasal stuffiness (congestion).  Itchy, runny nose with sneezing and tearing of the eyes. DIAGNOSIS Your health care provider can help you determine the allergen or allergens that trigger your symptoms. If you and your health care provider are unable to determine the allergen, skin or blood testing may be used. Your health care provider will diagnose your condition after taking your health history and performing a physical exam. Your health care provider may assess you for other related conditions, such as asthma, pink eye, or an ear infection. TREATMENT Allergic rhinitis does not have a cure, but it can be controlled by:  Medicines that block allergy symptoms. These may include allergy shots, nasal sprays, and oral antihistamines.  Avoiding the allergen. Hay fever may often be treated with antihistamines in pill or nasal spray forms. Antihistamines block the effects of histamine. There are over-the-counter medicines that may help with nasal congestion and swelling around the eyes. Check with your health care provider before taking or giving this medicine. If avoiding the allergen or the medicine prescribed do not work, there are many new medicines  your health care provider can prescribe. Stronger medicine may be used if initial measures are ineffective. Desensitizing injections can be used if medicine and avoidance does not work. Desensitization is when a patient is given ongoing shots until the body becomes less sensitive to the allergen. Make sure you follow up with your health care provider if problems continue. HOME CARE INSTRUCTIONS It is not possible to completely avoid allergens, but you can reduce your symptoms by taking steps to limit your exposure to them. It helps to know exactly what you are allergic to so that you can avoid your specific triggers. SEEK MEDICAL CARE IF:  You have a fever.  You develop a cough that does not stop easily (persistent).  You have shortness of breath.  You start wheezing.  Symptoms interfere with normal daily activities.   This information is not intended to replace advice given to you by your health care provider. Make sure you discuss any questions you have with your health care provider.   Document Released: 05/04/2001 Document Revised: 08/30/2014 Document Reviewed: 04/16/2013 Elsevier Interactive Patient Education 2016 [employer]  

## 2015-10-08 NOTE — ED Notes (Signed)
Pt reports sinus congestion started on SAT.

## 2015-10-08 NOTE — ED Notes (Signed)
Declined W/C at D/C and was escorted to lobby by RN. 

## 2017-01-31 ENCOUNTER — Emergency Department (HOSPITAL_COMMUNITY): Payer: Commercial Indemnity

## 2017-01-31 ENCOUNTER — Encounter (HOSPITAL_COMMUNITY): Payer: Self-pay | Admitting: Emergency Medicine

## 2017-01-31 ENCOUNTER — Emergency Department (HOSPITAL_COMMUNITY)
Admission: EM | Admit: 2017-01-31 | Discharge: 2017-02-01 | Disposition: A | Discharge status: Home / Self Care | Payer: Commercial Indemnity | Attending: Emergency Medicine | Admitting: Emergency Medicine

## 2017-01-31 DIAGNOSIS — Z79899 Other long term (current) drug therapy: Secondary | ICD-10-CM | POA: Diagnosis not present

## 2017-01-31 DIAGNOSIS — O99351 Diseases of the nervous system complicating pregnancy, first trimester: Secondary | ICD-10-CM | POA: Diagnosis not present

## 2017-01-31 DIAGNOSIS — G43009 Migraine without aura, not intractable, without status migrainosus: Secondary | ICD-10-CM

## 2017-01-31 DIAGNOSIS — O0281 Inappropriate change in quantitative human chorionic gonadotropin (hCG) in early pregnancy: Secondary | ICD-10-CM | POA: Diagnosis not present

## 2017-01-31 DIAGNOSIS — E039 Hypothyroidism, unspecified: Secondary | ICD-10-CM | POA: Diagnosis not present

## 2017-01-31 DIAGNOSIS — Z5181 Encounter for therapeutic drug level monitoring: Secondary | ICD-10-CM | POA: Insufficient documentation

## 2017-01-31 DIAGNOSIS — Z3A01 Less than 8 weeks gestation of pregnancy: Secondary | ICD-10-CM | POA: Diagnosis not present

## 2017-01-31 DIAGNOSIS — R202 Paresthesia of skin: Secondary | ICD-10-CM

## 2017-01-31 DIAGNOSIS — R200 Anesthesia of skin: Secondary | ICD-10-CM

## 2017-01-31 LAB — COMPREHENSIVE METABOLIC PANEL
ALBUMIN: 4.7 g/dL (ref 3.5–5.0)
ALT: 14 U/L (ref 14–54)
AST: 20 U/L (ref 15–41)
Alkaline Phosphatase: 48 U/L (ref 38–126)
Anion gap: 11 (ref 5–15)
BUN: 7 mg/dL (ref 6–20)
CHLORIDE: 106 mmol/L (ref 101–111)
CO2: 19 mmol/L — AB (ref 22–32)
Calcium: 9.6 mg/dL (ref 8.9–10.3)
Creatinine, Ser: 0.6 mg/dL (ref 0.44–1.00)
GFR calc Af Amer: >60 mL/min (ref >60)
Glucose, Bld: 103 mg/dL — ABNORMAL HIGH (ref 65–99)
POTASSIUM: 3.1 mmol/L — AB (ref 3.5–5.1)
SODIUM: 136 mmol/L (ref 135–145)
Total Bilirubin: 1 mg/dL (ref 0.3–1.2)
Total Protein: 7.6 g/dL (ref 6.5–8.1)

## 2017-01-31 LAB — I-STAT CHEM 8, ED
BUN: 6 mg/dL (ref 6–20)
CHLORIDE: 104 mmol/L (ref 101–111)
Calcium, Ion: 1.13 mmol/L — ABNORMAL LOW (ref 1.15–1.40)
Creatinine, Ser: 0.4 mg/dL — ABNORMAL LOW (ref 0.44–1.00)
Glucose, Bld: 101 mg/dL — ABNORMAL HIGH (ref 65–99)
HEMATOCRIT: 42 % (ref 36.0–46.0)
HEMOGLOBIN: 14.3 g/dL (ref 12.0–15.0)
POTASSIUM: 3.1 mmol/L — AB (ref 3.5–5.1)
Sodium: 138 mmol/L (ref 135–145)
TCO2: 20 mmol/L (ref 0–100)

## 2017-01-31 LAB — DIFFERENTIAL
BASOS ABS: 0 10*3/uL (ref 0.0–0.1)
BASOS PCT: 0 %
EOS ABS: 0.1 10*3/uL (ref 0.0–0.7)
Eosinophils Relative: 1 %
Lymphocytes Relative: 28 %
Lymphs Abs: 2.5 10*3/uL (ref 0.7–4.0)
MONOS PCT: 5 %
Monocytes Absolute: 0.4 10*3/uL (ref 0.1–1.0)
NEUTROS ABS: 6.1 10*3/uL (ref 1.7–7.7)
NEUTROS PCT: 66 %

## 2017-01-31 LAB — CBC
HCT: 41.9 % (ref 36.0–46.0)
Hemoglobin: 14.6 g/dL (ref 12.0–15.0)
MCH: 30 pg (ref 26.0–34.0)
MCHC: 34.8 g/dL (ref 30.0–36.0)
MCV: 86 fL (ref 78.0–100.0)
PLATELETS: 279 10*3/uL (ref 150–400)
RBC: 4.87 MIL/uL (ref 3.87–5.11)
RDW: 12.1 % (ref 11.5–15.5)
WBC: 9.1 10*3/uL (ref 4.0–10.5)

## 2017-01-31 LAB — PROTIME-INR
INR: 1.07
PROTHROMBIN TIME: 13.9 s (ref 11.4–15.2)

## 2017-01-31 LAB — I-STAT TROPONIN, ED: TROPONIN I, POC: 0 ng/mL (ref 0.00–0.08)

## 2017-01-31 LAB — APTT: APTT: 28 s (ref 24–36)

## 2017-01-31 LAB — I-STAT BETA HCG BLOOD, ED (MC, WL, AP ONLY)

## 2017-01-31 LAB — ETHANOL

## 2017-01-31 LAB — CBG MONITORING, ED: Glucose-Capillary: 94 mg/dL (ref 65–99)

## 2017-01-31 MED ORDER — LORAZEPAM 2 MG/ML IJ SOLN
1.0000 mg | Freq: Once | INTRAMUSCULAR | Status: AC
  Administered 2017-01-31: 1 mg via INTRAVENOUS
  Filled 2017-01-31: qty 1

## 2017-01-31 MED ORDER — METOCLOPRAMIDE HCL 5 MG/ML IJ SOLN
10.0000 mg | Freq: Once | INTRAMUSCULAR | Status: AC
  Administered 2017-01-31: 10 mg via INTRAVENOUS
  Filled 2017-01-31: qty 2

## 2017-01-31 NOTE — ED Triage Notes (Signed)
Pt presents to ED after having severe anxiety and hyperventilating.  Hands cramped, c/o headache, c/o generalized numbness.  States she was on the phone and was having difficulty remembering correct words.  Family states patient has been under a large amount of stress with family, being a single mom, and working full-time.  Patient denies hx of anxiety.

## 2017-01-31 NOTE — ED Notes (Signed)
Rees MD at bedside  

## 2017-01-31 NOTE — ED Notes (Signed)
Dr. Adriana Simasook in to see patient.  Patient continues to have slurred speech, continues to have inability to move extremities.  Dr. Adriana Simasook instructed this RN to call a Code Stroke

## 2017-01-31 NOTE — ED Notes (Signed)
EDP at bedside  

## 2017-02-01 ENCOUNTER — Emergency Department (HOSPITAL_COMMUNITY): Payer: Commercial Indemnity

## 2017-02-01 LAB — RAPID URINE DRUG SCREEN, HOSP PERFORMED
AMPHETAMINES: NOT DETECTED
BARBITURATES: NOT DETECTED
Benzodiazepines: NOT DETECTED
Cocaine: NOT DETECTED
Opiates: NOT DETECTED
TETRAHYDROCANNABINOL: NOT DETECTED

## 2017-02-01 LAB — URINALYSIS, ROUTINE W REFLEX MICROSCOPIC
BILIRUBIN URINE: NEGATIVE
GLUCOSE, UA: NEGATIVE mg/dL
HGB URINE DIPSTICK: NEGATIVE
Ketones, ur: 5 mg/dL — AB
NITRITE: NEGATIVE
PH: 7 (ref 5.0–8.0)
Protein, ur: NEGATIVE mg/dL
SPECIFIC GRAVITY, URINE: 1.01 (ref 1.005–1.030)

## 2017-02-01 LAB — HCG, QUANTITATIVE, PREGNANCY: hCG, Beta Chain, Quant, S: 9401 m[IU]/mL — ABNORMAL HIGH (ref <5)

## 2017-02-01 NOTE — ED Provider Notes (Signed)
[facility]-EMERGENCY DEPT Provider Note   CSN: [id number] Arrival date & time: 01/31/17  2025     History   Chief Complaint Chief Complaint  Patient presents with  . Anxiety    HPI Sandra Rich is a 26 y.o. female.  The history is provided by the patient and a relative. No language interpreter was used.  Anxiety    Sandra Rich is a 26 y.o. female who presents to the Emergency Department complaining of headache, difficulty speaking.  Level V caveat due to altered mental status.  History is provided by the patient's aunt. She states that the patient came to her complaining of facial numbness and bilateral lower extremity numbness, weakness, cramping. When she was in route to the hospital she began to develop speech difficulties with stuttering speech as well as a right periorbital headache. No prior history of similar symptoms or medical problems. Symptoms are severe, constant, worsening. Past Medical History:  Diagnosis Date  . Eczema   . Medical history non-contributory   . 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  . Thyroid disease    hypo    Patient Active Problem List   Diagnosis Date Noted  . Active labor at term 11/25/2012  . SVD (spontaneous vaginal delivery) 11/25/2012    Past Surgical History:  Procedure Laterality Date  . FRACTURE SURGERY    . RHINOPLASTY    . wisdom teeth removal      OB History    Gravida Para Term Preterm AB Living   1 1 1     1    SAB TAB Ectopic Multiple Live Births           1       Home Medications    Prior to Admission medications   Medication Sig Start Date End Date Taking? Authorizing Provider  fluticasone (FLONASE) 50 MCG/ACT nasal spray Place 2 sprays into both nostrils daily. Patient not taking: Reported on 01/31/2017 10/08/15 11/05/15  Fayrene Helper, PA-C  promethazine (PHENERGAN) 25 MG tablet Take 0.5-1 tablets (12.5-25 mg total) by mouth every 6 (six) hours as needed for nausea. Patient not taking: Reported on 01/31/2017  10/26/12   Hurshel Party, CNM    Family History Family History  Problem Relation Age of Onset  . Depression Brother     Social History Social History  Substance Use Topics  . Smoking status: Never Smoker  . Smokeless tobacco: Never Used  . Alcohol use No     Comment: occ     Allergies   Morphine; Morphine and related; and Food color red [red dye]   Review of Systems Review of Systems  All other systems reviewed and are negative.    Physical Exam Updated Vital Signs BP 114/90   Pulse (!) 130   Temp 98.9 F (37.2 C)   Resp 17   SpO2 100%   Physical Exam  Constitutional: She appears well-developed and well-nourished. She appears distressed.  HENT:  Head: Normocephalic and atraumatic.  Cardiovascular: Regular rhythm.   No murmur heard. Tachycardic  Pulmonary/Chest: Effort normal and breath sounds normal. No respiratory distress.  Abdominal: Soft. There is no tenderness. There is no rebound and no guarding.  Musculoskeletal: She exhibits no edema or tenderness.  Neurological: She is alert.  Moves all extremities symmetrically but has difficulty in following commands. Tremulous in bilateral upper extremities. Stuttering speech  Skin: Skin is warm and dry.  Psychiatric:  Anxious and tearful  Nursing note and vitals reviewed.  ED Treatments / Results  Labs (all labs ordered are listed, but only abnormal results are displayed) Labs Reviewed  COMPREHENSIVE METABOLIC PANEL - Abnormal; Notable for the following:       Result Value   Potassium 3.1 (*)    CO2 19 (*)    Glucose, Bld 103 (*)    All other components within normal limits  URINALYSIS, ROUTINE W REFLEX MICROSCOPIC - Abnormal; Notable for the following:    Ketones, ur 5 (*)    Leukocytes, UA SMALL (*)    Bacteria, UA RARE (*)    Squamous Epithelial / LPF 0-5 (*)    All other components within normal limits  I-STAT CHEM 8, ED - Abnormal; Notable for the following:    Potassium 3.1 (*)     Creatinine, Ser 0.40 (*)    Glucose, Bld 101 (*)    Calcium, Ion 1.13 (*)    All other components within normal limits  I-STAT BETA HCG BLOOD, ED ([facility], [facility], [facility] ONLY) - Abnormal; Notable for the following:    I-stat hCG, quantitative >2,000.0 (*)    All other components within normal limits  ETHANOL  PROTIME-INR  APTT  CBC  DIFFERENTIAL  RAPID URINE DRUG SCREEN, HOSP PERFORMED  HCG, QUANTITATIVE, PREGNANCY  I-STAT TROPOININ, ED  CBG MONITORING, ED    EKG  EKG Interpretation  Date/Time:  Monday January 31 2017 21:12:05 EDT Ventricular Rate:  100 PR Interval:    QRS Duration: 93 QT Interval:  343 QTC Calculation: 443 R Axis:   85 Text Interpretation:  Sinus tachycardia Confirmed by Lincoln Brighamees, Liz [phone]([id number]) on 01/31/2017 9:44:13 PM       Radiology Ct Head Wo Contrast  Result Date: 01/31/2017 CLINICAL DATA:  Acute onset of facial numbness and generalized numbness. Confusion. Initial encounter. EXAM: CT HEAD WITHOUT CONTRAST TECHNIQUE: Contiguous axial images were obtained from the base of the skull through the vertex without intravenous contrast. COMPARISON:  Maxillofacial CT performed 02/04/2011 FINDINGS: Brain: No evidence of acute infarction, hemorrhage, hydrocephalus, extra-axial collection or mass lesion/mass effect. The posterior fossa, including the cerebellum, brainstem and fourth ventricle, is within normal limits. The third and lateral ventricles, and basal ganglia are unremarkable in appearance. The cerebral hemispheres are symmetric in appearance, with normal gray-white differentiation. No mass effect or midline shift is seen. Vascular: No hyperdense vessel or unexpected calcification. Skull: There is no evidence of fracture; visualized osseous structures are unremarkable in appearance. Sinuses/Orbits: The orbits are within normal limits. The paranasal sinuses and mastoid air cells are well-aerated. Other: No significant soft tissue abnormalities are seen. IMPRESSION: Unremarkable  noncontrast CT of the head. Electronically Signed   By: Roanna RaiderJeffery  Chang M.D.   On: 01/31/2017 21:16   [place] Ob Comp < 14 Wks  Result Date: 02/01/2017 CLINICAL DATA:  Acute onset of near-syncope.  Initial encounter. EXAM: OBSTETRIC <14 WK US AND TRANSVAGINAL OB US TECHNIQUE: Both transabdominal and transvaginal ultrasound examinations were performed for complete evaluation of the gestation as well as the maternal uterus, adnexal regions, and pelvic cul-de-sac. Transvaginal technique was performed to assess early pregnancy. COMPARISON:  None. FINDINGS: Intrauterine gestational sac: Single; visualized and normal in shape. Yolk sac:  No Embryo:  No Cardiac Activity: N/A MSD: 8.3 mm   5 w   4 d Subchorionic hemorrhage:  None visualized. Maternal uterus/adnexae: The uterus is unremarkable in appearance. The ovaries are unremarkable in appearance. The right ovary measures 3.3 x 2.5 x 2.3 cm, while the left ovary measures 3.2 x 2.1  x 1.6 cm. There is no evidence for ovarian torsion. No free fluid is seen within the pelvic cul-de-sac. IMPRESSION: Single intrauterine gestational sac noted, with a mean sac diameter of 8 mm, corresponding to a gestational age of [redacted] weeks 4 days. It remains too early to determine an estimated date of delivery. No yolk sac or embryo is yet seen. If the patient's quantitative beta HCG continues to trend upward, follow-up pelvic ultrasound could be performed in 2 weeks for further evaluation. Electronically Signed   By: Roanna Raider M.D.   On: 02/01/2017 01:33   US Ob Transvaginal  Result Date: 02/01/2017 CLINICAL DATA:  Acute onset of near-syncope.  Initial encounter. EXAM: OBSTETRIC <14 WK [place] AND TRANSVAGINAL OB US TECHNIQUE: Both transabdominal and transvaginal ultrasound examinations were performed for complete evaluation of the gestation as well as the maternal uterus, adnexal regions, and pelvic cul-de-sac. Transvaginal technique was performed to assess early pregnancy. COMPARISON:   None. FINDINGS: Intrauterine gestational sac: Single; visualized and normal in shape. Yolk sac:  No Embryo:  No Cardiac Activity: N/A MSD: 8.3 mm   5 w   4 d Subchorionic hemorrhage:  None visualized. Maternal uterus/adnexae: The uterus is unremarkable in appearance. The ovaries are unremarkable in appearance. The right ovary measures 3.3 x 2.5 x 2.3 cm, while the left ovary measures 3.2 x 2.1 x 1.6 cm. There is no evidence for ovarian torsion. No free fluid is seen within the pelvic cul-de-sac. IMPRESSION: Single intrauterine gestational sac noted, with a mean sac diameter of 8 mm, corresponding to a gestational age of [redacted] weeks 4 days. It remains too early to determine an estimated date of delivery. No yolk sac or embryo is yet seen. If the patient's quantitative beta HCG continues to trend upward, follow-up pelvic ultrasound could be performed in 2 weeks for further evaluation. Electronically Signed   By: Roanna Raider M.D.   On: 02/01/2017 01:33    Procedures Procedures (including critical care time)  Medications Ordered in ED Medications  LORazepam (ATIVAN) injection 1 mg (1 mg Intravenous Given 01/31/17 2127)  metoCLOPramide (REGLAN) injection 10 mg (10 mg Intravenous Given 01/31/17 2127)     Initial Impression / Assessment and Plan / ED Course  I have reviewed the triage vital signs and the nursing notes.  Pertinent labs & imaging results that were available during my care of the patient were reviewed by me and considered in my medical decision making (see chart for details).     Patient here for evaluation of diffuse facial numbness, numbness and weakness in bilateral legs, right-sided headache and speech difficulties. She was considerably anxious on initial evaluation with a nonfocal neurologic exam. She was given Ativan for her anxiety as well as Reglan for her headache. On repeat assessment her headache is resolved and her neurologic complaints have resolved. She has fluent speech and is  at her neurologic baseline. Question migraine plus panic attack versus complicated migraine. Presentation is not consistent with CVA, dissection, subarachnoid hemorrhage. Patient is incidentally found to be pregnant. She has irregular menses and unknown date of last menstrual cycle. Pelvic ultrasound was gestational sac but no other further follow-up for evidence of IUP. Counseled patient on home care with OB/GYN follow-up as well as close return precautions.  Final Clinical Impressions(s) / ED Diagnoses   Final diagnoses:  Migraine without aura and without status migrainosus, not intractable    New Prescriptions Current Discharge Medication List       Tilden Fossa, MD 02/01/17  0156  

## 2017-02-01 NOTE — ED Notes (Signed)
Unable to do initial NIHSS d/t pt having panic attack and unable to follow commands. Pt is now awake and responding and able to completely assess. NIHSS noted to be 0.

## 2017-02-01 NOTE — Discharge Instructions (Signed)
You are pregnant today. You are too early along in your pregnancy to determine if this is a normal healthy pregnancy. Please contact your OB/GYN for close follow-up. Get rechecked immediately if you develop abdominal pain, vaginal bleeding, shortness of breath or feel as if he might pass out. Start taking a prenatal vitamin, available over-the-counter.

## 2017-02-16 LAB — OB RESULTS CONSOLE HIV ANTIBODY (ROUTINE TESTING): HIV: NONREACTIVE

## 2017-02-16 LAB — OB RESULTS CONSOLE HEPATITIS B SURFACE ANTIGEN: HEP B S AG: NEGATIVE

## 2017-02-16 LAB — OB RESULTS CONSOLE ABO/RH: RH Type: POSITIVE

## 2017-02-16 LAB — OB RESULTS CONSOLE GC/CHLAMYDIA
Chlamydia: NEGATIVE
Gonorrhea: NEGATIVE

## 2017-02-16 LAB — OB RESULTS CONSOLE ANTIBODY SCREEN: Antibody Screen: NEGATIVE

## 2017-02-16 LAB — OB RESULTS CONSOLE RPR: RPR: NONREACTIVE

## 2017-02-16 LAB — OB RESULTS CONSOLE RUBELLA ANTIBODY, IGM: RUBELLA: IMMUNE

## 2017-02-21 ENCOUNTER — Inpatient Hospital Stay (HOSPITAL_COMMUNITY)
Admission: AD | Admit: 2017-02-21 | Discharge: 2017-02-21 | Disposition: A | Discharge status: Home / Self Care | Payer: Commercial Indemnity | Source: Ambulatory Visit | Attending: Obstetrics and Gynecology | Admitting: Obstetrics and Gynecology

## 2017-02-21 ENCOUNTER — Encounter (HOSPITAL_COMMUNITY): Payer: Self-pay | Admitting: *Deleted

## 2017-02-21 DIAGNOSIS — O219 Vomiting of pregnancy, unspecified: Secondary | ICD-10-CM | POA: Diagnosis not present

## 2017-02-21 DIAGNOSIS — Z3A01 Less than 8 weeks gestation of pregnancy: Secondary | ICD-10-CM | POA: Insufficient documentation

## 2017-02-21 DIAGNOSIS — O210 Mild hyperemesis gravidarum: Secondary | ICD-10-CM | POA: Diagnosis not present

## 2017-02-21 LAB — URINALYSIS, ROUTINE W REFLEX MICROSCOPIC
Bilirubin Urine: NEGATIVE
Glucose, UA: NEGATIVE mg/dL
HGB URINE DIPSTICK: NEGATIVE
Ketones, ur: NEGATIVE mg/dL
Leukocytes, UA: NEGATIVE
Nitrite: NEGATIVE
PH: 7 (ref 5.0–8.0)
Protein, ur: NEGATIVE mg/dL
SPECIFIC GRAVITY, URINE: 1.014 (ref 1.005–1.030)

## 2017-02-21 LAB — POCT PREGNANCY, URINE: Preg Test, Ur: POSITIVE — AB

## 2017-02-21 MED ORDER — DOXYLAMINE-PYRIDOXINE 10-10 MG PO TBEC: 2.0000 | DELAYED_RELEASE_TABLET | Freq: Every day | ORAL | 3 refills | Status: DC

## 2017-02-21 MED ORDER — PROMETHAZINE HCL 25 MG/ML IJ SOLN
25.0000 mg | Freq: Once | INTRAMUSCULAR | Status: DC
  Filled 2017-02-21: qty 1

## 2017-02-21 NOTE — Discharge Instructions (Signed)

## 2017-02-21 NOTE — MAU Provider Note (Signed)
History     CSN: [id number]  Arrival date and time: 24/2/18 1513   First Provider Initiated Contact with Patient 24/02/18 1559      Chief Complaint  Patient presents with  . Emesis   Sandra RecordsHannah Mcwilliams is a 26 y.o. G2P1001 at 1843w6d who presents today with nausea and vomiting. She states that she has not been able to keep anything down today. She has not taken anything for the nausea at home. She states that she was given a rx for "something", but she did not take it because she was worried about the side effects. She was then given a rx for "something that starts with a R". She did not take that today because she called the nurse and was told she needed IV fluids. She reports 1.5 pound weight loss since 02/16/17.   Emesis   This is a new problem. The current episode started in the past 7 days. The problem occurs more than 10 times per day. The problem has been unchanged. The emesis has an appearance of stomach contents. There has been no fever. Pertinent negatives include no chills, diarrhea or fever. Risk factors: pregnancy  She has tried nothing for the symptoms. The treatment provided no relief.   Past Medical History:  Diagnosis Date  . Eczema   . Medical history non-contributory   . 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  . Thyroid disease    hypo    Past Surgical History:  Procedure Laterality Date  . FRACTURE SURGERY    . RHINOPLASTY    . wisdom teeth removal      Family History  Problem Relation Age of Onset  . Depression Brother     Social History  Substance Use Topics  . Smoking status: Never Smoker  . Smokeless tobacco: Never Used  . Alcohol use No     Comment: occ    Allergies:  Allergies  Allergen Reactions  . Morphine Swelling and Other (See Comments)    Reaction:  All over body swelling  . Food Color Red [Red Dye] Itching, Swelling and Other (See Comments)    Reaction:  Lip swelling    Prescriptions Prior to Admission  Medication Sig Dispense Refill Last  Dose  . Prenatal MV-Min-FA-Omega-3 (PRENATAL GUMMIES/DHA & FA) 0.4-32.5 MG CHEW Chew 2 each by mouth daily.   02/21/2017 at Unknown time    Review of Systems  Constitutional: Negative for chills and fever.  Gastrointestinal: Positive for nausea and vomiting. Negative for diarrhea.  Genitourinary: Negative for dysuria, pelvic pain and vaginal bleeding.   Physical Exam   Blood pressure 117/72, pulse 94, temperature 98.3 F (36.8 C), resp. rate 18, weight 103 lb (46.7 kg), last menstrual period 12/28/2016.  Physical Exam  Nursing note and vitals reviewed. Constitutional: She is oriented to person, place, and time. She appears well-developed and well-nourished. No distress.  HENT:  Head: Normocephalic.  Cardiovascular: Normal rate.   Respiratory: Effort normal.  GI: Soft. There is no tenderness. There is no rebound.  Neurological: She is alert and oriented to person, place, and time.  Skin: Skin is warm and dry.  Psychiatric: She has a normal mood and affect.   Results for orders placed or performed during the hospital encounter of 02/21/17 (from the past 24 hour(s))  Urinalysis, Routine w reflex microscopic     Status: Abnormal   Collection Time: 02/21/17  3:20 PM  Result Value Ref Range   Color, Urine YELLOW YELLOW   APPearance  HAZY (A) CLEAR   Specific Gravity, Urine 1.014 1.005 - 1.030   pH 7.0 5.0 - 8.0   Glucose, UA NEGATIVE NEGATIVE mg/dL   Hgb urine dipstick NEGATIVE NEGATIVE   Bilirubin Urine NEGATIVE NEGATIVE   Ketones, ur NEGATIVE NEGATIVE mg/dL   Protein, ur NEGATIVE NEGATIVE mg/dL   Nitrite NEGATIVE NEGATIVE   Leukocytes, UA NEGATIVE NEGATIVE  Pregnancy, urine POC     Status: Abnormal   Collection Time: 02/21/17  3:35 PM  Result Value Ref Range   Preg Test, Ur POSITIVE (A) NEGATIVE    MAU Course  Procedures  MDM D/W the patient that she does not appear dehydrated on UA, and that IV fluids are not indicated today. I offered her IM medications today as she has  not tried any nausea medications at home. At first she was resistant to trying this. However, after discussing with the patient at length that sometimes we need to try different medications over time to see what works best with the least amount of side effects she said she would be willing to try that today.    1624: D/W Dr. Claiborne Billings ok for DC home with diclegis if she doesn't have that.   1650: Patient is refusing IM phenergan. She is requesting crackers and ginger ale. Tolerating PO here.  Assessment and Plan   1. Nausea/vomiting in pregnancy   2. [redacted] weeks gestation of pregnancy    DC home Comfort measures reviewed  1st Trimester precautions  RX: diclegis as directed  Return to MAU as needed FU with OB as planned   Follow-up Information    Waynard Reeds, MD Follow up.   Specialty:  Obstetrics and Gynecology Contact information: [address] [place] [address] [phone]            Sandra Rich 02/21/2017, 5:06 PM

## 2017-02-21 NOTE — MAU Note (Signed)
Pt presents to MAU with nausea and vomiting that worsened over the weekend. PT states she was given medications for nausea but did not take the meds due to the side effects. Denies any pain or VB

## 2017-02-21 NOTE — MAU Note (Signed)
Pt refused phenergan, tolerated crackers, sprite

## 2017-05-17 ENCOUNTER — Inpatient Hospital Stay (HOSPITAL_COMMUNITY)
Admission: AD | Admit: 2017-05-17 | Discharge: 2017-05-17 | Disposition: A | Discharge status: Home / Self Care | Payer: Commercial Indemnity | Source: Ambulatory Visit | Attending: Obstetrics and Gynecology | Admitting: Obstetrics and Gynecology

## 2017-05-17 ENCOUNTER — Encounter (HOSPITAL_COMMUNITY): Payer: Self-pay

## 2017-05-17 DIAGNOSIS — O99612 Diseases of the digestive system complicating pregnancy, second trimester: Secondary | ICD-10-CM | POA: Diagnosis not present

## 2017-05-17 DIAGNOSIS — O26892 Other specified pregnancy related conditions, second trimester: Secondary | ICD-10-CM | POA: Diagnosis not present

## 2017-05-17 DIAGNOSIS — Z3A20 20 weeks gestation of pregnancy: Secondary | ICD-10-CM | POA: Diagnosis not present

## 2017-05-17 DIAGNOSIS — R109 Unspecified abdominal pain: Secondary | ICD-10-CM | POA: Insufficient documentation

## 2017-05-17 DIAGNOSIS — K5900 Constipation, unspecified: Secondary | ICD-10-CM | POA: Insufficient documentation

## 2017-05-17 LAB — URINALYSIS, ROUTINE W REFLEX MICROSCOPIC
BILIRUBIN URINE: NEGATIVE
Glucose, UA: NEGATIVE mg/dL
HGB URINE DIPSTICK: NEGATIVE
Ketones, ur: NEGATIVE mg/dL
Nitrite: NEGATIVE
PROTEIN: NEGATIVE mg/dL
SPECIFIC GRAVITY, URINE: 1.01 (ref 1.005–1.030)
pH: 5 (ref 5.0–8.0)

## 2017-05-17 LAB — WET PREP, GENITAL
SPERM: NONE SEEN
TRICH WET PREP: NONE SEEN
YEAST WET PREP: NONE SEEN

## 2017-05-17 MED ORDER — METRONIDAZOLE 500 MG PO TABS: 500.0000 mg | ORAL_TABLET | Freq: Three times a day (TID) | ORAL | 0 refills | Status: AC

## 2017-05-17 NOTE — MAU Provider Note (Signed)
History    Patient Sandra Rich is a 26 y.o. G2P1001 At [redacted]w[redacted]d here with complaints of abdominal pain that started at 2 pm today. She denies bleeding, contractions or leaking of fluid.   Patient has a history of constipation; her last BM was yesterday and she describes it as hard and small. She has not been taking anything for her constipation.  CSN: [id number]  Arrival date and time: 05/17/17 2100   None     Chief Complaint  Patient presents with  . Abdominal Pain   Abdominal Pain  This is a new problem. The current episode started today. The onset quality is sudden. The problem occurs intermittently. The pain is located in the suprapubic region. Quality: She is unable to describe what type of pain it is; she does not think it is a cramping pain.  The abdominal pain does not radiate. Associated symptoms include constipation. Pertinent negatives include no diarrhea, dysuria, nausea or vomiting. Nothing aggravates the pain. The pain is relieved by nothing.    OB History    Gravida Para Term Preterm AB Living   SAB TAB Ectopic Multiple Live Births           1      Past Medical History:  Diagnosis Date  . Eczema   . Medical history non-contributory   . 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  . Thyroid disease    hypo    Past Surgical History:  Procedure Laterality Date  . FRACTURE SURGERY    . RHINOPLASTY    . wisdom teeth removal      Family History  Problem Relation Age of Onset  . Depression Brother     Social History  Substance Use Topics  . Smoking status: Never Smoker  . Smokeless tobacco: Never Used  . Alcohol use No     Comment: occ    Allergies:  Allergies  Allergen Reactions  . Morphine Swelling and Other (See Comments)    Reaction:  All over body swelling  . Food Color Red [Red Dye] Itching, Swelling and Other (See Comments)    Reaction:  Lip swelling    Prescriptions Prior to Admission  Medication Sig Dispense Refill Last Dose  .  Prenatal MV-Min-FA-Omega-3 (PRENATAL GUMMIES/DHA & FA) 0.4-32.5 MG CHEW Chew 2 each by mouth daily.   05/16/2017 at Unknown time  . Doxylamine-Pyridoxine (DICLEGIS) 10-10 MG TBEC Take 2 tablets by mouth at bedtime. If sx cont.add one tablet q morning starting on day 3. If sx persist add 1 tablet q afternoon on day 4. 60 tablet 3     Review of Systems  HENT: Negative.   Respiratory: Negative.   Cardiovascular: Negative.   Gastrointestinal: Positive for abdominal pain and constipation. Negative for diarrhea, nausea and vomiting.  Genitourinary: Negative for dysuria.  Musculoskeletal: Negative.   Neurological: Negative.   Psychiatric/Behavioral: Negative.    Physical Exam   Blood pressure 112/61, pulse 87, temperature 98.2 F (36.8 C), temperature source Oral, resp. rate 16, height  (1.549 m), weight 112 lb (50.8 kg), last menstrual period 12/28/2016, SpO2 100 %.  Physical Exam  Constitutional: She appears well-developed and well-nourished.  HENT:  Head: Normocephalic.  Neck: Normal range of motion.  Respiratory: Effort normal.  GI: Soft.  Genitourinary:  Genitourinary Comments: NEFG; no CMT, suprapubic or adnexal tenderness. Cervix is long, closed and thick.   Musculoskeletal: Normal range of motion.  Neurological: She  is alert.  Skin: Skin is warm and dry.  Psychiatric: She has a normal mood and affect.    MAU Course  Procedures  MDM -FHR: 150 -gc pending -UA shows leuks, UC sent -wet prep positive for clue cells Abdominal pain may be related to constipation or normal changes of the body in pregnancy.  Assessment and Plan   1. Constipation during pregnancy in second trimester    2. Patient stable for discharge with recommendations to increase fiber, reduce carb intake and start taking docusate sodium daily.  3. RX for Flagyl given.  4. Plan of care discussed with Dr. Henderson Cloud, who agrees.  5. Return precautions given.   Charlesetta Garibaldi Kooistra 05/17/2017, 11:16  PM

## 2017-05-17 NOTE — MAU Note (Signed)
Pt reports lower abdominal pain and tightening. Feels the baby kicking and is not sure if that pain is related. States she has had constipation throughout pregnancy but this week has been bad. States she had 2 BM's today but were really hard. Pt denies vaginal bleeding or discharge. Pt not on any medication for constipation.

## 2017-05-17 NOTE — Discharge Instructions (Signed)
Constipation, Adult °Constipation is when a person: °· Poops (has a bowel movement) fewer times in a week than normal. °· Has a hard time pooping. °· Has poop that is dry, hard, or bigger than normal. °Follow these instructions at home: °Eating and drinking °· Eat foods that have a lot of fiber, such as: °¨ Fresh fruits and vegetables. °¨ Whole grains. °¨ Beans. °· Eat less of foods that are high in fat, low in fiber, or overly processed, such as: °¨ French fries. °¨ Hamburgers. °¨ Cookies. °¨ Candy. °¨ Soda. °· Drink enough fluid to keep your pee (urine) clear or pale yellow. °General instructions °· Exercise regularly or as told by your doctor. °· Go to the restroom when you feel like you need to poop. Do not hold it in. °· Take over-the-counter and prescription medicines only as told by your doctor. These include any fiber supplements. °· Do pelvic floor retraining exercises, such as: °¨ Doing deep breathing while relaxing your lower belly (abdomen). °¨ Relaxing your pelvic floor while pooping. °· Watch your condition for any changes. °· Keep all follow-up visits as told by your doctor. This is important. °Contact a doctor if: °· You have pain that gets worse. °· You have a fever. °· You have not pooped for 4 days. °· You throw up (vomit). °· You are not hungry. °· You lose weight. °· You are bleeding from the anus. °· You have thin, pencil-like poop (stool). °Get help right away if: °· You have a fever, and your symptoms suddenly get worse. °· You leak poop or have blood in your poop. °· Your belly feels hard or bigger than normal (is bloated). °· You have very bad belly pain. °· You feel dizzy or you faint. °This information is not intended to replace advice given to you by your health care provider. Make sure you discuss any questions you have with your health care provider. °Document Released: 01/26/2008 Document Revised: 02/27/2016 Document Reviewed: 01/28/2016 °Elsevier Interactive Patient Education © 2017  [employer] ° ° °Bacterial Vaginosis °Bacterial vaginosis is an infection of the vagina. It happens when too many germs (bacteria) grow in the vagina. This infection puts you at risk for infections from sex (STIs). Treating this infection can lower your risk for some STIs. You should also treat this if you are pregnant. It can cause your baby to be born early. °Follow these instructions at home: °Medicines °· Take over-the-counter and prescription medicines only as told by your doctor. °· Take or use your antibiotic medicine as told by your doctor. Do not stop taking or using it even if you start to feel better. °General instructions °· If you your sexual partner is a woman, tell her that you have this infection. She needs to get treatment if she has symptoms. If you have a female partner, he does not need to be treated. °· During treatment: °¨ Avoid sex. °¨ Do not douche. °¨ Avoid alcohol as told. °¨ Avoid breastfeeding as told. °· Drink enough fluid to keep your pee (urine) clear or pale yellow. °· Keep your vagina and butt (rectum) clean. °¨ Wash the area with warm water every day. °¨ Wipe from front to back after you use the toilet. °· Keep all follow-up visits as told by your doctor. This is important. °Preventing this condition °· Do not douche. °· Use only warm water to wash around your vagina. °· Use protection when you have sex. This includes: °¨ Latex condoms. °¨ Dental   dams. °· Limit how many people you have sex with. It is best to only have sex with the same person (be monogamous). °· Get tested for STIs. Have your partner get tested. °· Wear underwear that is cotton or lined with cotton. °· Avoid tight pants and pantyhose. This is most important in summer. °· Do not use any products that have nicotine or tobacco in them. These include cigarettes and e-cigarettes. If you need help quitting, ask your doctor. °· Do not use illegal drugs. °· Limit how much alcohol you drink. °Contact a doctor if: °· Your  symptoms do not get better, even after you are treated. °· You have more discharge or pain when you pee (urinate). °· You have a fever. °· You have pain in your belly (abdomen). °· You have pain with sex. °· Your bleed from your vagina between periods. °Summary °· This infection happens when too many germs (bacteria) grow in the vagina. °· Treating this condition can lower your risk for some infections from sex (STIs). °· You should also treat this if you are pregnant. It can cause early (premature) birth. °· Do not stop taking or using your antibiotic medicine even if you start to feel better. °This information is not intended to replace advice given to you by your health care provider. Make sure you discuss any questions you have with your health care provider. °Document Released: 05/18/2008 Document Revised: 04/24/2016 Document Reviewed: 04/24/2016 °Elsevier Interactive Patient Education © 2017 [employer] Inc. ° °

## 2017-05-18 LAB — GC/CHLAMYDIA PROBE AMP (~~LOC~~) NOT AT ARMC
Chlamydia: NEGATIVE
NEISSERIA GONORRHEA: NEGATIVE

## 2017-08-23 NOTE — L&D Delivery Note (Signed)
Pt progressed along a nl labor curve without complications.During the second phase she had decels with pushing. She was pushing poorly because of the heavy epidural . The VE was placed at +3 station. She had a SVD of one live viable white female over an intact perineum in the ROA position. [facility]acent[facility]

## 2017-08-31 ENCOUNTER — Inpatient Hospital Stay (HOSPITAL_COMMUNITY)
Admission: AD | Admit: 2017-08-31 | Discharge: 2017-08-31 | Disposition: A | Discharge status: Home / Self Care | Payer: Medicaid Other | Source: Ambulatory Visit | Attending: Obstetrics and Gynecology | Admitting: Obstetrics and Gynecology

## 2017-08-31 ENCOUNTER — Encounter (HOSPITAL_COMMUNITY): Payer: Self-pay

## 2017-08-31 ENCOUNTER — Inpatient Hospital Stay (HOSPITAL_COMMUNITY): Payer: Medicaid Other

## 2017-08-31 ENCOUNTER — Other Ambulatory Visit: Payer: Self-pay

## 2017-08-31 DIAGNOSIS — Z818 Family history of other mental and behavioral disorders: Secondary | ICD-10-CM | POA: Insufficient documentation

## 2017-08-31 DIAGNOSIS — O26893 Other specified pregnancy related conditions, third trimester: Secondary | ICD-10-CM

## 2017-08-31 DIAGNOSIS — Z885 Allergy status to narcotic agent status: Secondary | ICD-10-CM | POA: Insufficient documentation

## 2017-08-31 DIAGNOSIS — R109 Unspecified abdominal pain: Secondary | ICD-10-CM | POA: Insufficient documentation

## 2017-08-31 DIAGNOSIS — O4703 False labor before 37 completed weeks of gestation, third trimester: Secondary | ICD-10-CM | POA: Insufficient documentation

## 2017-08-31 DIAGNOSIS — R197 Diarrhea, unspecified: Secondary | ICD-10-CM | POA: Diagnosis present

## 2017-08-31 DIAGNOSIS — Z3689 Encounter for other specified antenatal screening: Secondary | ICD-10-CM

## 2017-08-31 DIAGNOSIS — Z888 Allergy status to other drugs, medicaments and biological substances status: Secondary | ICD-10-CM | POA: Diagnosis not present

## 2017-08-31 DIAGNOSIS — O368390 Maternal care for abnormalities of the fetal heart rate or rhythm, unspecified trimester, not applicable or unspecified: Secondary | ICD-10-CM

## 2017-08-31 DIAGNOSIS — O26899 Other specified pregnancy related conditions, unspecified trimester: Secondary | ICD-10-CM

## 2017-08-31 DIAGNOSIS — Z3A35 35 weeks gestation of pregnancy: Secondary | ICD-10-CM

## 2017-08-31 LAB — CBC
HEMATOCRIT: 33.6 % — AB (ref 36.0–46.0)
HEMOGLOBIN: 11 g/dL — AB (ref 12.0–15.0)
MCH: 27.5 pg (ref 26.0–34.0)
MCHC: 32.7 g/dL (ref 30.0–36.0)
MCV: 84 fL (ref 78.0–100.0)
Platelets: 249 10*3/uL (ref 150–400)
RBC: 4 MIL/uL (ref 3.87–5.11)
RDW: 13.7 % (ref 11.5–15.5)
WBC: 13.6 10*3/uL — ABNORMAL HIGH (ref 4.0–10.5)

## 2017-08-31 LAB — COMPREHENSIVE METABOLIC PANEL
ALBUMIN: 3.2 g/dL — AB (ref 3.5–5.0)
ALK PHOS: 116 U/L (ref 38–126)
ALT: 13 U/L — AB (ref 14–54)
ANION GAP: 10 (ref 5–15)
AST: 20 U/L (ref 15–41)
BILIRUBIN TOTAL: 0.3 mg/dL (ref 0.3–1.2)
BUN: 6 mg/dL (ref 6–20)
CALCIUM: 8.7 mg/dL — AB (ref 8.9–10.3)
CO2: 20 mmol/L — ABNORMAL LOW (ref 22–32)
CREATININE: 0.39 mg/dL — AB (ref 0.44–1.00)
Chloride: 108 mmol/L (ref 101–111)
GFR calc Af Amer: >60 mL/min (ref >60)
GFR calc non Af Amer: >60 mL/min (ref >60)
GLUCOSE: 83 mg/dL (ref 65–99)
Potassium: 3.8 mmol/L (ref 3.5–5.1)
Sodium: 138 mmol/L (ref 135–145)
TOTAL PROTEIN: 6.5 g/dL (ref 6.5–8.1)

## 2017-08-31 LAB — RAPID URINE DRUG SCREEN, HOSP PERFORMED
Amphetamines: NOT DETECTED
Barbiturates: NOT DETECTED
Benzodiazepines: NOT DETECTED
Cocaine: NOT DETECTED
Opiates: NOT DETECTED
Tetrahydrocannabinol: NOT DETECTED

## 2017-08-31 MED ORDER — LACTATED RINGERS IV BOLUS (SEPSIS)
1000.0000 mL | Freq: Once | INTRAVENOUS | Status: AC
  Administered 2017-08-31: 1000 mL via INTRAVENOUS

## 2017-08-31 MED ORDER — LACTATED RINGERS IV SOLN
INTRAVENOUS | Status: DC
  Administered 2017-08-31: 20:00:00 via INTRAVENOUS

## 2017-08-31 MED ORDER — BUTORPHANOL TARTRATE 1 MG/ML IJ SOLN
2.0000 mg | Freq: Once | INTRAMUSCULAR | Status: AC
  Administered 2017-08-31: 1 mg via INTRAVENOUS
  Filled 2017-08-31: qty 2

## 2017-08-31 MED ORDER — NIFEDIPINE 10 MG PO CAPS: 10.0000 mg | ORAL_CAPSULE | Freq: Four times a day (QID) | ORAL | 0 refills | Status: DC | PRN

## 2017-08-31 MED ORDER — NIFEDIPINE 10 MG PO CAPS
10.0000 mg | ORAL_CAPSULE | ORAL | Status: AC | PRN
  Administered 2017-08-31 (×3): 10 mg via ORAL
  Filled 2017-08-31 (×3): qty 1

## 2017-08-31 MED ORDER — BETAMETHASONE SOD PHOS & ACET 6 (3-3) MG/ML IJ SUSP
12.0000 mg | Freq: Once | INTRAMUSCULAR | Status: AC
  Administered 2017-08-31: 12 mg via INTRAMUSCULAR
  Filled 2017-08-31: qty 2

## 2017-08-31 NOTE — MAU Note (Signed)
Urine in lab 

## 2017-08-31 NOTE — MAU Provider Note (Signed)
Chief Complaint:  Contractions   None     HPI: Sandra Rich is a 27 y.o. G2P1001 at 835w3dwho presents to maternity admissions reporting onset of diarrhea x 4-5 times this morning followed by onset of abdominal cramping/back pain ~2 hours before arrival in MAU.  She describes the back pain as sharp, associated with abdominal tightening.  The pain is worsening over time, becoming stronger and closer together. She is breathing and tearful with her pain in MAU and cannot find a comfortable position.  The pain is intermittent, with complete relaxation of her abdomen and no pain in between. The diarrhea resolved with cramping/back pain began with no episodes x 3-4 hours.  She denies any bleeding, leakage of fluid, or vaginal discharge/itching.  She reports good fetal movement, denies urinary symptoms, h/a, dizziness, n/v, or fever/chills.  There are no other associated symptoms.  She has not tried any treatments.  HPI  Past Medical History: Past Medical History:  Diagnosis Date  . Eczema   . Medical history non-contributory   . 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  . Thyroid disease    hypo    Past obstetric history: OB History  Gravida Para Term Preterm AB Living  2 1 1     1   SAB TAB Ectopic Multiple Live Births          1    # Outcome Date GA Lbr Len/2nd Weight Sex Delivery Anes PTL Lv  2 Current           1 Term 11/25/12 1265w2d 25:54 / 02:44 8 lb 3 oz (3.714 kg) M Vag-Spont EPI  LIV     Birth Comments: WNL      Past Surgical History: Past Surgical History:  Procedure Laterality Date  . FRACTURE SURGERY    . RHINOPLASTY    . wisdom teeth removal      Family History: Family History  Problem Relation Age of Onset  . Depression Brother     Social History: Social History   Tobacco Use  . Smoking status: Never Smoker  . Smokeless tobacco: Never Used  Substance Use Topics  . Alcohol use: No    Comment: occ  . Drug use: No    Allergies:  Allergies  Allergen Reactions   . Morphine Swelling and Other (See Comments)    Reaction:  All over body swelling  . Stadol [Butorphanol] Other (See Comments)    Dizziness and blurred vision  . Food Color Red [Red Dye] Itching, Swelling and Other (See Comments)    Reaction:  Lip swelling    Meds:  Medications Prior to Admission  Medication Sig Dispense Refill Last Dose  . Prenatal MV-Min-FA-Omega-3 (PRENATAL GUMMIES/DHA & FA) 0.4-32.5 MG CHEW Chew 2 each by mouth daily.   Past Week at Unknown time  . Doxylamine-Pyridoxine (DICLEGIS) 10-10 MG TBEC Take 2 tablets by mouth at bedtime. If sx cont.add one tablet q morning starting on day 3. If sx persist add 1 tablet q afternoon on day 4. (Patient not taking: Reported on 08/31/2017) 60 tablet 3 Not Taking at Unknown time    ROS:  Review of Systems  Constitutional: Negative for chills, fatigue and fever.  Eyes: Negative for visual disturbance.  Respiratory: Negative for shortness of breath.   Cardiovascular: Negative for chest pain.  Gastrointestinal: Positive for abdominal pain and diarrhea. Negative for nausea and vomiting.  Genitourinary: Positive for pelvic pain. Negative for difficulty urinating, dysuria, flank pain, vaginal bleeding, vaginal discharge and  vaginal pain.  Neurological: Negative for dizziness and headaches.  Psychiatric/Behavioral: Negative.      I have reviewed patient's Past Medical Hx, Surgical Hx, Family Hx, Social Hx, medications and allergies.   Physical Exam   Patient Vitals for the past 24 hrs:  BP Temp Temp src Pulse Resp Height Weight  08/31/17 1900 - 97.7 F (36.5 C) Oral - - - -  08/31/17 1749 107/62 - - - - - -  08/31/17 1726 125/76 - - - - - -  08/31/17 1651 131/82 - - - - - -  08/31/17 1540 121/66 98.7 F (37.1 C) Oral (!) 123 20 5' (1.524 m) 128 lb 4 oz (58.2 kg)   Constitutional: Well-developed, well-nourished female in no acute distress.  Cardiovascular: normal rate Respiratory: normal effort GI: Abd soft, non-tender,  gravid appropriate for gestational age.  MS: Extremities nontender, no edema, normal ROM Neurologic: Alert and oriented x 4.  GU: Neg CVAT.  PELVIC EXAM: Cervix pink, visually closed, without lesion, scant white creamy discharge, vaginal walls and external genitalia normal Bimanual exam: Cervix 0/long/high, firm, anterior, neg CMT, uterus nontender, nonenlarged, adnexa without tenderness, enlargement, or mass  Dilation: 1 Effacement (%): 50 Cervical Position: Posterior Exam by:: Sharen Counter, CNM  FHT:  Baseline 145, moderate variability, accelerations present, recurrent variable decelerations lasting 60-70 seconds down to 90s Contractions: q 2-4 mins, moderate to palpation   Labs: Results for orders placed or performed during the hospital encounter of 08/31/17 (from the past 24 hour(s))  CBC     Status: Abnormal   Collection Time: 08/31/17  4:26 PM  Result Value Ref Range   WBC 13.6 (H) 4.0 - 10.5 K/uL   RBC 4.00 3.87 - 5.11 MIL/uL   Hemoglobin 11.0 (L) 12.0 - 15.0 g/dL   HCT 16.1 (L) 09.6 - 04.5 %   MCV 84.0 78.0 - 100.0 fL   MCH 27.5 26.0 - 34.0 pg   MCHC 32.7 30.0 - 36.0 g/dL   RDW 40.9 81.1 - 91.4 %   Platelets 249 150 - 400 K/uL  Comprehensive metabolic panel     Status: Abnormal   Collection Time: 08/31/17  4:26 PM  Result Value Ref Range   Sodium 138 135 - 145 mmol/L   Potassium 3.8 3.5 - 5.1 mmol/L   Chloride 108 101 - 111 mmol/L   CO2 20 (L) 22 - 32 mmol/L   Glucose, Bld 83 65 - 99 mg/dL   BUN 6 6 - 20 mg/dL   Creatinine, Ser 7.82 (L) 0.44 - 1.00 mg/dL   Calcium 8.7 (L) 8.9 - 10.3 mg/dL   Total Protein 6.5 6.5 - 8.1 g/dL   Albumin 3.2 (L) 3.5 - 5.0 g/dL   AST 20 15 - 41 U/L   ALT 13 (L) 14 - 54 U/L   Alkaline Phosphatase 116 38 - 126 U/L   Total Bilirubin 0.3 0.3 - 1.2 mg/dL   GFR calc non Af Amer >60 >60 mL/min   GFR calc Af Amer >60 >60 mL/min   Anion gap 10 5 - 15  Urine rapid drug screen (hosp performed)     Status: None   Collection Time:  08/31/17  6:05 PM  Result Value Ref Range   Opiates NONE DETECTED NONE DETECTED   Cocaine NONE DETECTED NONE DETECTED   Benzodiazepines NONE DETECTED NONE DETECTED   Amphetamines NONE DETECTED NONE DETECTED   Tetrahydrocannabinol NONE DETECTED NONE DETECTED   Barbiturates NONE DETECTED NONE DETECTED  Imaging:  No results found.  MAU Course/MDM: CBC, CMP, UA NST reviewed and nonreactive with variables Q 1 hour x 3 UA with moderate leukocytes,nitrite negative otherwise wnl. Send for culture. Cervix unchanged in 1 hour in MAU but pt reports increased intermittent pain and frequent contractions noted on toco Consult Dr Claiborne Billings with presentation, exam findings and test results.  LR bolus, Procardia 10 mg x 3 Q 20 minutes PRN, betamethasone IM dose now, Limited OB US to evaluate placenta and BPP   BPP with 8/8, Limited ob US with normal placenta Contractions further apart on toco after Procardia x 3 doses, pt reports pain continues but less frequent contractions Fetal tachycardia noted with baseline 165, maternal heartrate remains 115-125 unchanged from admission No evidence of fever, dehydration, no elevated WBCs IV fluids continue at 125 Consult Dr Claiborne Billings with tachycardia.  Extended monitoring for 2 more hours, report any concerns. Stadol 2 mg ordered but when RN gave 1 mg, pt reported dizziness and blurred vision and declined remaining 1 mg, symptoms resolved spontaneously, c/w side effect of medication, not allergic reaction  Extended fetal monitoring with reactive NST, baseline 150 to 155, with resolved tachycardia and no decels x 2+ hours.   D/C home with Rx for Procardia PRN for contractions Keep appt tomorrow with Dr Tenny Craw Return to MAU in 24 hours for second dose of BMZ Return to MAU for signs of labor or emergencies  Pt discharge with strict preterm labor precautions and fetal kick counts.  Today's evaluation included a work-up for preterm labor which can be  life-threatening for both mom and baby.  Assessment: 1. Threatened premature labor in third trimester   2. Variable fetal heart rate decelerations, antepartum   3. Abdominal pain during pregnancy, third trimester   4. Abdominal pain affecting pregnancy, antepartum   5. Antepartum fetal tachycardia affecting care of mother   6. NST (non-stress test) reactive     Plan: Discharge home Labor precautions and fetal kick counts Follow-up Information    Waynard Reeds, MD Follow up.   Specialty:  Obstetrics and Gynecology Why:  On 09/01/17 as scheduled, then return to MAU after appointment for second dose of betamethasone. Return to MAU anytime as needed for signs of labor or emergencies. Contact information: [address] [place] [address] [phone]          Allergies as of 08/31/2017      Reactions   Morphine Swelling, Other (See Comments)   Reaction:  All over body swelling   Stadol [butorphanol] Other (See Comments)   Dizziness and blurred vision   Food Color Red [red Dye] Itching, Swelling, Other (See Comments)   Reaction:  Lip swelling      Medication List    STOP taking these medications   Doxylamine-Pyridoxine 10-10 MG Tbec Commonly known as:  DICLEGIS     TAKE these medications   NIFEdipine 10 MG capsule Commonly known as:  PROCARDIA Take 1 capsule (10 mg total) by mouth every 6 (six) hours as needed.   PRENATAL GUMMIES/DHA & FA 0.4-32.5 MG Chew Chew 2 each by mouth daily.       Sharen Counter Certified Nurse-Midwife 08/31/2017 10:12 PM

## 2017-08-31 NOTE — MAU Note (Signed)
Pt states she woke up with diarrhea and has continued to have diarrhea throughout the day. The pt is also having lower back and abdominal pain 10/10 intermittent.  Pt states she feels pressure in her vagina.   Pt denies vaginal bleeding or LOF.

## 2017-09-01 ENCOUNTER — Inpatient Hospital Stay (HOSPITAL_COMMUNITY)
Admission: AD | Admit: 2017-09-01 | Discharge: 2017-09-01 | Disposition: A | Discharge status: Home / Self Care | Payer: Medicaid Other | Source: Ambulatory Visit | Attending: Obstetrics and Gynecology | Admitting: Obstetrics and Gynecology

## 2017-09-01 DIAGNOSIS — O4703 False labor before 37 completed weeks of gestation, third trimester: Secondary | ICD-10-CM | POA: Insufficient documentation

## 2017-09-01 DIAGNOSIS — O26893 Other specified pregnancy related conditions, third trimester: Secondary | ICD-10-CM

## 2017-09-01 DIAGNOSIS — Z3A35 35 weeks gestation of pregnancy: Secondary | ICD-10-CM | POA: Insufficient documentation

## 2017-09-01 DIAGNOSIS — R109 Unspecified abdominal pain: Secondary | ICD-10-CM

## 2017-09-01 LAB — OB RESULTS CONSOLE GBS: GBS: NEGATIVE

## 2017-09-01 MED ORDER — BETAMETHASONE SOD PHOS & ACET 6 (3-3) MG/ML IJ SUSP
12.0000 mg | Freq: Once | INTRAMUSCULAR | Status: AC
  Administered 2017-09-01: 12 mg via INTRAMUSCULAR
  Filled 2017-09-01: qty 2

## 2017-09-01 NOTE — MAU Note (Signed)
Pt here of rrepeat BMZ, denies pain or bleeding

## 2017-09-03 LAB — CULTURE, OB URINE: Culture: 20000 — AB

## 2017-09-07 ENCOUNTER — Inpatient Hospital Stay (HOSPITAL_COMMUNITY)
Admission: AD | Admit: 2017-09-07 | Discharge: 2017-09-07 | Disposition: A | Discharge status: Home / Self Care | Payer: Medicaid Other | Source: Ambulatory Visit | Attending: Obstetrics | Admitting: Obstetrics

## 2017-09-07 ENCOUNTER — Encounter (HOSPITAL_COMMUNITY): Payer: Self-pay | Admitting: *Deleted

## 2017-09-07 DIAGNOSIS — O4703 False labor before 37 completed weeks of gestation, third trimester: Secondary | ICD-10-CM | POA: Insufficient documentation

## 2017-09-07 DIAGNOSIS — Z885 Allergy status to narcotic agent status: Secondary | ICD-10-CM | POA: Diagnosis not present

## 2017-09-07 DIAGNOSIS — Z818 Family history of other mental and behavioral disorders: Secondary | ICD-10-CM | POA: Insufficient documentation

## 2017-09-07 DIAGNOSIS — R42 Dizziness and giddiness: Secondary | ICD-10-CM | POA: Diagnosis not present

## 2017-09-07 DIAGNOSIS — R109 Unspecified abdominal pain: Secondary | ICD-10-CM | POA: Diagnosis present

## 2017-09-07 DIAGNOSIS — O479 False labor, unspecified: Secondary | ICD-10-CM

## 2017-09-07 DIAGNOSIS — R000 Tachycardia, unspecified: Secondary | ICD-10-CM | POA: Diagnosis not present

## 2017-09-07 DIAGNOSIS — O26893 Other specified pregnancy related conditions, third trimester: Secondary | ICD-10-CM | POA: Diagnosis not present

## 2017-09-07 DIAGNOSIS — Z888 Allergy status to other drugs, medicaments and biological substances status: Secondary | ICD-10-CM | POA: Insufficient documentation

## 2017-09-07 DIAGNOSIS — Z3A36 36 weeks gestation of pregnancy: Secondary | ICD-10-CM | POA: Diagnosis not present

## 2017-09-07 HISTORY — DX: Unspecified asthma, uncomplicated: J45.909

## 2017-09-07 LAB — URINALYSIS, ROUTINE W REFLEX MICROSCOPIC
Bilirubin Urine: NEGATIVE
Glucose, UA: NEGATIVE mg/dL
Hgb urine dipstick: NEGATIVE
Ketones, ur: NEGATIVE mg/dL
Nitrite: NEGATIVE
Protein, ur: NEGATIVE mg/dL
Specific Gravity, Urine: 1.015 (ref 1.005–1.030)
pH: 5 (ref 5.0–8.0)

## 2017-09-07 MED ORDER — NIFEDIPINE 10 MG PO CAPS
20.0000 mg | ORAL_CAPSULE | Freq: Once | ORAL | Status: AC
  Administered 2017-09-07: 20 mg via ORAL
  Filled 2017-09-07 (×2): qty 2

## 2017-09-07 NOTE — MAU Provider Note (Signed)
History     CSN: [id number]  Arrival date and time: 09/07/17 1525   First Provider Initiated Contact with Patient 09/07/17 1659      Chief Complaint  Patient presents with  . Abdominal Pain  . Back Pain   HPI   [name] is a 27 y.o. female G2P1001 @ [redacted]w[redacted]d here in MAU with contractions. States every time she has a contraction she feels pressure in her chest. She feels like the contraction is tightening and pushing up on her chest wall. States she is feeling the contractions every 15-20 minutes. She only feels the chest wall pain with a contraction. There is no radiation of pain.  The contractions are radiating around her her back. + fetal movement. No bleeding. States when she is standing up she feels dizzy. Last time she ate was cook out at 2:30, no vomiting however + nausea. States she had this dizziness when she was 20 weeks however it stopped. There was discussion about seeing cardiology, however symptoms stopped on own.   OB History    Gravida Para Term Preterm AB Living   2 1 1     1    SAB TAB Ectopic Multiple Live Births           1      Past Medical History:  Diagnosis Date  . Asthma    childhood  . Eczema   . Medical history non-contributory   . 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  . Thyroid disease    hypo    Past Surgical History:  Procedure Laterality Date  . FRACTURE SURGERY    . RHINOPLASTY    . wisdom teeth removal      Family History  Problem Relation Age of Onset  . Depression Brother     Social History   Tobacco Use  . Smoking status: Never Smoker  . Smokeless tobacco: Never Used  Substance Use Topics  . Alcohol use: No    Comment: occ  . Drug use: No    Allergies:  Allergies  Allergen Reactions  . Morphine Swelling and Other (See Comments)    Reaction:  All over body swelling  . Stadol [Butorphanol] Other (See Comments)    Dizziness and blurred vision  . Food Color Red [Red Dye] Itching, Swelling and Other (See Comments)     Reaction:  Lip swelling    Medications Prior to Admission  Medication Sig Dispense Refill Last Dose  . Prenatal Vit-Fe Fumarate-FA (PRENATAL MULTIVITAMIN) TABS tablet Take 1 tablet by mouth daily at 12 noon.   09/07/2017 at Unknown time  . valACYclovir (VALTREX) 500 MG tablet TAKE 1 TABLET BY MOUTH TWICE A DAY FOR 7 DAYS  3 09/06/2017 at Unknown time  . NIFEdipine (PROCARDIA) 10 MG capsule Take 1 capsule (10 mg total) by mouth every 6 (six) hours as needed. (Patient not taking: Reported on 09/07/2017) 30 capsule 0 Not Taking at Unknown time   Results for orders placed or performed during the hospital encounter of 09/07/17 (from the past 48 hour(s))  Urinalysis, Routine w reflex microscopic     Status: Abnormal   Collection Time: 09/07/17  3:49 PM  Result Value Ref Range   Color, Urine YELLOW YELLOW   APPearance CLOUDY (A) CLEAR   Specific Gravity, Urine 1.015 1.005 - 1.030   pH 5.0 5.0 - 8.0   Glucose, UA NEGATIVE NEGATIVE mg/dL   Hgb urine dipstick NEGATIVE NEGATIVE   Bilirubin Urine NEGATIVE NEGATIVE  Ketones, ur NEGATIVE NEGATIVE mg/dL   Protein, ur NEGATIVE NEGATIVE mg/dL   Nitrite NEGATIVE NEGATIVE   Leukocytes, UA LARGE (A) NEGATIVE   RBC / HPF 0-5 0 - 5 RBC/hpf   WBC, UA 6-30 0 - 5 WBC/hpf   Bacteria, UA RARE (A) NONE SEEN   Squamous Epithelial / LPF 6-30 (A) NONE SEEN   Mucus PRESENT     Review of Systems  Respiratory: Negative for shortness of breath.   Genitourinary: Negative for dysuria.  Neurological: Positive for dizziness.   Physical Exam   Blood pressure 119/68, pulse (!) 109, temperature 98.3 F (36.8 C), temperature source Oral, resp. rate 18, height 5' (1.524 m), weight 130 lb (59 kg), last menstrual period 12/28/2016, SpO2 99 %.  Physical Exam  Constitutional: She is oriented to person, place, and time. She appears well-developed and well-nourished. No distress.  HENT:  Head: Normocephalic.  Eyes: Pupils are equal, round, and reactive to light.   Respiratory: Effort normal and breath sounds normal. No respiratory distress. She has no wheezes. She has no rales.  GI: Soft. She exhibits no distension. There is no tenderness. There is no rebound.  Genitourinary:  Genitourinary Comments: Cervix: 1.5 cm, thick, posterior   Musculoskeletal: Normal range of motion.  Neurological: She is alert and oriented to person, place, and time.  Skin: Skin is warm and dry. She is not diaphoretic. There is pallor.  Psychiatric: Her behavior is normal.   Fetal Tracing: Baseline: 150 bpm Variability: Moderate  Accelerations: 15x15 Decelerations: None Toco: None   MAU Course  Procedures  None  MDM  EKG: normal other than sinus tachycardia, no sign of ST elevation.  Procardia 20 mg PO Discussed patient with Dr. Chestine Sporelark; ok for DC home.  Cervix unchanged from 2 days ago.  Urine culture pending   Assessment and Plan   A:  1. Braxton Hicks contractions   2. Spell of dizziness   3. Sinus tachycardia     P:  Discharge home with strict return precautions Change positions slowly Increase oral fluid intake, limit ice Return to MAU if symptoms worsen Follow up with OB as scheduled Preterm labor precautions  Rasch, Harolyn RutherfordJennifer I, NP 09/07/2017 8:03 PM

## 2017-09-07 NOTE — MAU Note (Signed)
Pt reports contractions that just started, pressure "everywhere", has had dizziness off and on today, back pain. Pressure in her chest when she has a contraction.

## 2017-09-07 NOTE — Discharge Instructions (Signed)
Braxton Hicks Contractions °Contractions of the uterus can occur throughout pregnancy, but they are not always a sign that you are in labor. You may have practice contractions called Braxton Hicks contractions. These false labor contractions are sometimes confused with true labor. °What are Braxton Hicks contractions? °Braxton Hicks contractions are tightening movements that occur in the muscles of the uterus before labor. Unlike true labor contractions, these contractions do not result in opening (dilation) and thinning of the cervix. Toward the end of pregnancy (32-34 weeks), Braxton Hicks contractions can happen more often and may become stronger. These contractions are sometimes difficult to tell apart from true labor because they can be very uncomfortable. You should not feel embarrassed if you go to the hospital with false labor. °Sometimes, the only way to tell if you are in true labor is for your health care provider to look for changes in the cervix. The health care provider will do a physical exam and may monitor your contractions. If you are not in true labor, the exam should show that your cervix is not dilating and your water has not broken. °If there are other health problems associated with your pregnancy, it is completely safe for you to be sent home with false labor. You may continue to have Braxton Hicks contractions until you go into true labor. °How to tell the difference between true labor and false labor °True labor °· Contractions last 30-70 seconds. °· Contractions become very regular. °· Discomfort is usually felt in the top of the uterus, and it spreads to the lower abdomen and low back. °· Contractions do not go away with walking. °· Contractions usually become more intense and increase in frequency. °· The cervix dilates and gets thinner. °False labor °· Contractions are usually shorter and not as strong as true labor contractions. °· Contractions are usually irregular. °· Contractions  are often felt in the front of the lower abdomen and in the groin. °· Contractions may go away when you walk around or change positions while lying down. °· Contractions get weaker and are shorter-lasting as time goes on. °· The cervix usually does not dilate or become thin. °Follow these instructions at home: °· Take over-the-counter and prescription medicines only as told by your health care provider. °· Keep up with your usual exercises and follow other instructions from your health care provider. °· Eat and drink lightly if you think you are going into labor. °· If Braxton Hicks contractions are making you uncomfortable: °? Change your position from lying down or resting to walking, or change from walking to resting. °? Sit and rest in a tub of warm water. °? Drink enough fluid to keep your urine pale yellow. Dehydration may cause these contractions. °? Do slow and deep breathing several times an hour. °· Keep all follow-up prenatal visits as told by your health care provider. This is important. °Contact a health care provider if: °· You have a fever. °· You have continuous pain in your abdomen. °Get help right away if: °· Your contractions become stronger, more regular, and closer together. °· You have fluid leaking or gushing from your vagina. °· You pass blood-tinged mucus (bloody show). °· You have bleeding from your vagina. °· You have low back pain that you never had before. °· You feel your baby’s head pushing down and causing pelvic pressure. °· Your baby is not moving inside you as much as it used to. °Summary °· Contractions that occur before labor are called Braxton   Hicks contractions, false labor, or practice contractions. °· Braxton Hicks contractions are usually shorter, weaker, farther apart, and less regular than true labor contractions. True labor contractions usually become progressively stronger and regular and they become more frequent. °· Manage discomfort from Braxton Hicks contractions by  changing position, resting in a warm bath, drinking plenty of water, or practicing deep breathing. °This information is not intended to replace advice given to you by your health care provider. Make sure you discuss any questions you have with your health care provider. °Document Released: 12/23/2016 Document Revised: 12/23/2016 Document Reviewed: 12/23/2016 °Elsevier Interactive Patient Education © 2018 [employer] ° °

## 2017-09-23 ENCOUNTER — Encounter (HOSPITAL_COMMUNITY): Payer: Self-pay

## 2017-09-23 ENCOUNTER — Inpatient Hospital Stay (HOSPITAL_COMMUNITY)
Admission: AD | Admit: 2017-09-23 | Discharge: 2017-09-23 | Disposition: A | Discharge status: Home / Self Care | Payer: Medicaid Other | Source: Ambulatory Visit | Attending: Obstetrics and Gynecology | Admitting: Obstetrics and Gynecology

## 2017-09-23 DIAGNOSIS — O368130 Decreased fetal movements, third trimester, not applicable or unspecified: Secondary | ICD-10-CM

## 2017-09-23 DIAGNOSIS — Z3A40 40 weeks gestation of pregnancy: Secondary | ICD-10-CM | POA: Diagnosis not present

## 2017-09-23 DIAGNOSIS — Z3483 Encounter for supervision of other normal pregnancy, third trimester: Secondary | ICD-10-CM | POA: Insufficient documentation

## 2017-09-23 NOTE — Discharge Instructions (Signed)

## 2017-09-23 NOTE — MAU Note (Signed)
Patient presents with decreased fetal movement today, feeling movements just not near as often as usual.

## 2017-09-28 ENCOUNTER — Inpatient Hospital Stay (HOSPITAL_COMMUNITY)
Admission: AD | Admit: 2017-09-28 | Discharge: 2017-09-28 | Disposition: A | Discharge status: Home / Self Care | Payer: Medicaid Other | Source: Ambulatory Visit | Attending: Obstetrics and Gynecology | Admitting: Obstetrics and Gynecology

## 2017-09-28 ENCOUNTER — Encounter (HOSPITAL_COMMUNITY): Payer: Self-pay

## 2017-09-28 DIAGNOSIS — O479 False labor, unspecified: Secondary | ICD-10-CM

## 2017-09-28 DIAGNOSIS — Z3483 Encounter for supervision of other normal pregnancy, third trimester: Secondary | ICD-10-CM | POA: Insufficient documentation

## 2017-09-28 DIAGNOSIS — Z3A40 40 weeks gestation of pregnancy: Secondary | ICD-10-CM | POA: Insufficient documentation

## 2017-09-28 NOTE — OB Triage Note (Signed)
Patient contracting only occasionally, cervix unchanged.  Orders given to discharge patient home with instructions on when to return per Dr. Henderson CloudHorvath.

## 2017-09-28 NOTE — MAU Note (Signed)
CTX since 1930.  Now 6 minutes apart.  Reports good FM.  No LOF/VB.  No hx of HSV or MRSA.

## 2017-10-03 ENCOUNTER — Encounter (HOSPITAL_COMMUNITY): Payer: Self-pay | Admitting: *Deleted

## 2017-10-03 ENCOUNTER — Telehealth (HOSPITAL_COMMUNITY): Payer: Self-pay | Admitting: *Deleted

## 2017-10-03 NOTE — Telephone Encounter (Signed)
Preadmission screen  

## 2017-10-04 ENCOUNTER — Encounter (HOSPITAL_COMMUNITY): Payer: Self-pay | Admitting: *Deleted

## 2017-10-04 ENCOUNTER — Inpatient Hospital Stay (HOSPITAL_COMMUNITY)
Admission: AD | Admit: 2017-10-04 | Discharge: 2017-10-04 | Disposition: A | Discharge status: Home / Self Care | Payer: Medicaid Other | Source: Ambulatory Visit | Attending: Obstetrics and Gynecology | Admitting: Obstetrics and Gynecology

## 2017-10-04 DIAGNOSIS — Z3490 Encounter for supervision of normal pregnancy, unspecified, unspecified trimester: Secondary | ICD-10-CM | POA: Insufficient documentation

## 2017-10-04 DIAGNOSIS — O479 False labor, unspecified: Secondary | ICD-10-CM

## 2017-10-04 NOTE — MAU Note (Signed)
Pt presents with complaint of contractions and bloody discharge.

## 2017-10-04 NOTE — Progress Notes (Addendum)
  Pt presented for labor evaluation Initial cervical exam 1-2 cm, not significantly changed from prior office exam.  Pt advised that after a reactive tracing was obtained she could ambulate for an hour to see cervical change would occur. Pt initially poorly compliant with obtaining a fetal tracing due to complaints of skin irritation from the NST straps. RN provided comfort measures to acommodate the patient's concerns. Adequate fetal tracing was obtained Pt declined ambulation and preferred discharge home with return when contractions worsened Reactive tracing, cat 1 tracing

## 2017-10-04 NOTE — MAU Note (Signed)
Pt presents with c/o regular ctxs since 1100 this morning.  Reports bloody show, denies LOF.  Reports +FM. Last VE 1cm

## 2017-10-04 NOTE — Discharge Instructions (Signed)

## 2017-10-06 ENCOUNTER — Inpatient Hospital Stay (HOSPITAL_COMMUNITY)
Admission: AD | Admit: 2017-10-06 | Discharge: 2017-10-11 | DRG: 807 | Disposition: A | Discharge status: Home / Self Care | Payer: Medicaid Other | Source: Ambulatory Visit | Attending: Obstetrics and Gynecology | Admitting: Obstetrics and Gynecology

## 2017-10-06 ENCOUNTER — Inpatient Hospital Stay (HOSPITAL_COMMUNITY): Payer: Medicaid Other | Admitting: Anesthesiology

## 2017-10-06 ENCOUNTER — Encounter (HOSPITAL_COMMUNITY): Payer: Self-pay

## 2017-10-06 ENCOUNTER — Other Ambulatory Visit: Payer: Self-pay

## 2017-10-06 DIAGNOSIS — R531 Weakness: Secondary | ICD-10-CM | POA: Diagnosis not present

## 2017-10-06 DIAGNOSIS — M21372 Foot drop, left foot: Secondary | ICD-10-CM | POA: Diagnosis not present

## 2017-10-06 DIAGNOSIS — F4320 Adjustment disorder, unspecified: Secondary | ICD-10-CM

## 2017-10-06 DIAGNOSIS — M79605 Pain in left leg: Secondary | ICD-10-CM | POA: Diagnosis not present

## 2017-10-06 DIAGNOSIS — O99344 Other mental disorders complicating childbirth: Secondary | ICD-10-CM | POA: Diagnosis present

## 2017-10-06 DIAGNOSIS — F4322 Adjustment disorder with anxiety: Secondary | ICD-10-CM | POA: Diagnosis present

## 2017-10-06 DIAGNOSIS — O480 Post-term pregnancy: Secondary | ICD-10-CM | POA: Diagnosis present

## 2017-10-06 DIAGNOSIS — Z3A41 41 weeks gestation of pregnancy: Secondary | ICD-10-CM

## 2017-10-06 DIAGNOSIS — Z3490 Encounter for supervision of normal pregnancy, unspecified, unspecified trimester: Secondary | ICD-10-CM

## 2017-10-06 DIAGNOSIS — O9089 Other complications of the puerperium, not elsewhere classified: Secondary | ICD-10-CM | POA: Diagnosis not present

## 2017-10-06 DIAGNOSIS — Z818 Family history of other mental and behavioral disorders: Secondary | ICD-10-CM | POA: Diagnosis not present

## 2017-10-06 DIAGNOSIS — Z008 Encounter for other general examination: Secondary | ICD-10-CM | POA: Diagnosis not present

## 2017-10-06 DIAGNOSIS — R29898 Other symptoms and signs involving the musculoskeletal system: Secondary | ICD-10-CM | POA: Diagnosis not present

## 2017-10-06 DIAGNOSIS — Z739 Problem related to life management difficulty, unspecified: Secondary | ICD-10-CM | POA: Diagnosis not present

## 2017-10-06 DIAGNOSIS — M79604 Pain in right leg: Secondary | ICD-10-CM | POA: Diagnosis not present

## 2017-10-06 LAB — CBC
HEMATOCRIT: 33.2 % — AB (ref 36.0–46.0)
HEMOGLOBIN: 11.1 g/dL — AB (ref 12.0–15.0)
MCH: 26.4 pg (ref 26.0–34.0)
MCHC: 33.4 g/dL (ref 30.0–36.0)
MCV: 79 fL (ref 78.0–100.0)
PLATELETS: 219 10*3/uL (ref 150–400)
RBC: 4.2 MIL/uL (ref 3.87–5.11)
RDW: 14.4 % (ref 11.5–15.5)
WBC: 16.3 10*3/uL — AB (ref 4.0–10.5)

## 2017-10-06 LAB — TYPE AND SCREEN
ABO/RH(D): B POS
Antibody Screen: NEGATIVE

## 2017-10-06 LAB — RPR: RPR: NONREACTIVE

## 2017-10-06 MED ORDER — LACTATED RINGERS IV SOLN
INTRAVENOUS | Status: DC
  Administered 2017-10-06: 01:00:00 via INTRAVENOUS

## 2017-10-06 MED ORDER — SENNOSIDES-DOCUSATE SODIUM 8.6-50 MG PO TABS: 2.0000 | ORAL_TABLET | ORAL | Status: DC

## 2017-10-06 MED ORDER — SOD CITRATE-CITRIC ACID 500-334 MG/5ML PO SOLN: 30.0000 mL | ORAL | Status: DC | PRN

## 2017-10-06 MED ORDER — OXYTOCIN BOLUS FROM INFUSION: 500.0000 mL | Freq: Once | INTRAVENOUS | Status: DC

## 2017-10-06 MED ORDER — LACTATED RINGERS IV SOLN: 500.0000 mL | INTRAVENOUS | Status: DC | PRN

## 2017-10-06 MED ORDER — EPHEDRINE 5 MG/ML INJ
10.0000 mg | INTRAVENOUS | Status: DC | PRN
  Filled 2017-10-06: qty 2

## 2017-10-06 MED ORDER — OXYTOCIN 40 UNITS IN LACTATED RINGERS INFUSION - SIMPLE MED
2.5000 [IU]/h | INTRAVENOUS | Status: DC
  Filled 2017-10-06: qty 1000

## 2017-10-06 MED ORDER — OXYTOCIN 40 UNITS IN LACTATED RINGERS INFUSION - SIMPLE MED: 1.0000 m[IU]/min | INTRAVENOUS | Status: DC

## 2017-10-06 MED ORDER — OXYCODONE HCL 5 MG PO TABS
5.0000 mg | ORAL_TABLET | Freq: Four times a day (QID) | ORAL | Status: DC | PRN
  Administered 2017-10-06 – 2017-10-09 (×5): 5 mg via ORAL
  Filled 2017-10-06 (×6): qty 1

## 2017-10-06 MED ORDER — OXYTOCIN BOLUS FROM INFUSION
500.0000 mL | Freq: Once | INTRAVENOUS | Status: AC
  Administered 2017-10-06: 500 mL via INTRAVENOUS

## 2017-10-06 MED ORDER — ONDANSETRON HCL 4 MG PO TABS: 4.0000 mg | ORAL_TABLET | ORAL | Status: DC | PRN

## 2017-10-06 MED ORDER — TETANUS-DIPHTH-ACELL PERTUSSIS 5-2.5-18.5 LF-MCG/0.5 IM SUSP: 0.5000 mL | Freq: Once | INTRAMUSCULAR | Status: DC

## 2017-10-06 MED ORDER — PHENYLEPHRINE 40 MCG/ML (10ML) SYRINGE FOR IV PUSH (FOR BLOOD PRESSURE SUPPORT)
80.0000 ug | PREFILLED_SYRINGE | INTRAVENOUS | Status: DC | PRN
  Filled 2017-10-06: qty 5

## 2017-10-06 MED ORDER — OXYTOCIN 40 UNITS IN LACTATED RINGERS INFUSION - SIMPLE MED: 2.5000 [IU]/h | INTRAVENOUS | Status: DC

## 2017-10-06 MED ORDER — DIBUCAINE 1 % RE OINT
1.0000 | TOPICAL_OINTMENT | RECTAL | Status: DC | PRN
Start: 2017-10-06 — End: 2017-10-11

## 2017-10-06 MED ORDER — COCONUT OIL OIL
1.0000 "application " | TOPICAL_OIL | Status: DC | PRN
  Administered 2017-10-07: 1 via TOPICAL
  Filled 2017-10-06 (×2): qty 120

## 2017-10-06 MED ORDER — WITCH HAZEL-GLYCERIN EX PADS
1.0000 | MEDICATED_PAD | CUTANEOUS | Status: DC | PRN
Start: 2017-10-06 — End: 2017-10-11

## 2017-10-06 MED ORDER — MEASLES, MUMPS & RUBELLA VAC ~~LOC~~ INJ
0.5000 mL | INJECTION | Freq: Once | SUBCUTANEOUS | Status: DC
  Filled 2017-10-06: qty 0.5

## 2017-10-06 MED ORDER — OXYCODONE-ACETAMINOPHEN 5-325 MG PO TABS: 2.0000 | ORAL_TABLET | ORAL | Status: DC | PRN

## 2017-10-06 MED ORDER — LIDOCAINE HCL (PF) 1 % IJ SOLN: 30.0000 mL | INTRAMUSCULAR | Status: DC | PRN

## 2017-10-06 MED ORDER — BENZOCAINE-MENTHOL 20-0.5 % EX AERO
1.0000 "application " | INHALATION_SPRAY | CUTANEOUS | Status: DC | PRN
  Administered 2017-10-06: 1 via TOPICAL
  Filled 2017-10-06 (×2): qty 56

## 2017-10-06 MED ORDER — SIMETHICONE 80 MG PO CHEW
80.0000 mg | CHEWABLE_TABLET | ORAL | Status: DC | PRN
Start: 2017-10-06 — End: 2017-10-11

## 2017-10-06 MED ORDER — EPHEDRINE 5 MG/ML INJ: 10.0000 mg | INTRAVENOUS | Status: DC | PRN

## 2017-10-06 MED ORDER — LIDOCAINE HCL (PF) 1 % IJ SOLN
INTRAMUSCULAR | Status: DC | PRN
  Administered 2017-10-06: 3 mL
  Administered 2017-10-06: 5 mL
  Administered 2017-10-06: 2 mL

## 2017-10-06 MED ORDER — ACETAMINOPHEN 325 MG PO TABS: 650.0000 mg | ORAL_TABLET | ORAL | Status: DC | PRN

## 2017-10-06 MED ORDER — LIDOCAINE HCL (PF) 1 % IJ SOLN
30.0000 mL | INTRAMUSCULAR | Status: DC | PRN
  Filled 2017-10-06: qty 30

## 2017-10-06 MED ORDER — IBUPROFEN 600 MG PO TABS
600.0000 mg | ORAL_TABLET | Freq: Four times a day (QID) | ORAL | Status: DC
  Administered 2017-10-06 – 2017-10-11 (×18): 600 mg via ORAL
  Filled 2017-10-06 (×19): qty 1

## 2017-10-06 MED ORDER — FLEET ENEMA 7-19 GM/118ML RE ENEM: 1.0000 | ENEMA | RECTAL | Status: DC | PRN

## 2017-10-06 MED ORDER — FENTANYL 2.5 MCG/ML BUPIVACAINE 1/10 % EPIDURAL INFUSION (WH - ANES)
14.0000 mL/h | INTRAMUSCULAR | Status: DC | PRN
  Administered 2017-10-06: 14 mL/h via EPIDURAL
  Filled 2017-10-06: qty 100

## 2017-10-06 MED ORDER — LACTATED RINGERS IV SOLN: 500.0000 mL | Freq: Once | INTRAVENOUS | Status: DC

## 2017-10-06 MED ORDER — LACTATED RINGERS IV SOLN: INTRAVENOUS | Status: DC

## 2017-10-06 MED ORDER — ONDANSETRON HCL 4 MG/2ML IJ SOLN: 4.0000 mg | Freq: Four times a day (QID) | INTRAMUSCULAR | Status: DC | PRN

## 2017-10-06 MED ORDER — PHENYLEPHRINE 40 MCG/ML (10ML) SYRINGE FOR IV PUSH (FOR BLOOD PRESSURE SUPPORT)
80.0000 ug | PREFILLED_SYRINGE | INTRAVENOUS | Status: DC | PRN
  Filled 2017-10-06: qty 10
  Filled 2017-10-06: qty 5

## 2017-10-06 MED ORDER — OXYCODONE-ACETAMINOPHEN 5-325 MG PO TABS: 1.0000 | ORAL_TABLET | ORAL | Status: DC | PRN

## 2017-10-06 MED ORDER — ZOLPIDEM TARTRATE 5 MG PO TABS: 5.0000 mg | ORAL_TABLET | Freq: Every evening | ORAL | Status: DC | PRN

## 2017-10-06 MED ORDER — DIPHENHYDRAMINE HCL 50 MG/ML IJ SOLN: 12.5000 mg | INTRAMUSCULAR | Status: DC | PRN

## 2017-10-06 MED ORDER — TERBUTALINE SULFATE 1 MG/ML IJ SOLN
0.2500 mg | Freq: Once | INTRAMUSCULAR | Status: DC | PRN
  Filled 2017-10-06: qty 1

## 2017-10-06 MED ORDER — ONDANSETRON HCL 4 MG/2ML IJ SOLN: 4.0000 mg | INTRAMUSCULAR | Status: DC | PRN

## 2017-10-06 MED ORDER — ACETAMINOPHEN 325 MG PO TABS
650.0000 mg | ORAL_TABLET | ORAL | Status: DC | PRN
  Administered 2017-10-06 – 2017-10-09 (×4): 650 mg via ORAL
  Filled 2017-10-06 (×4): qty 2

## 2017-10-06 MED ORDER — PHENYLEPHRINE 40 MCG/ML (10ML) SYRINGE FOR IV PUSH (FOR BLOOD PRESSURE SUPPORT): 80.0000 ug | PREFILLED_SYRINGE | INTRAVENOUS | Status: DC | PRN

## 2017-10-06 MED ORDER — FENTANYL CITRATE (PF) 100 MCG/2ML IJ SOLN: 50.0000 ug | INTRAMUSCULAR | Status: DC | PRN

## 2017-10-06 NOTE — H&P (Signed)
Pt is an 27 y.o. G2P1001 6461w4d white female who presented the ER c/o contractions.She was checked by the ER staff and was reported to have a cervical dialation of 4-5cm. She was admitted for labor. Her PNC was uncomplicated. GBS-neg/NlFTS/ Nl OGTT/ Nl AFP.     Past Medical History:  Diagnosis Date  . Anxiety   . Asthma    childhood  . Eczema   . Medical history non-contributory   . 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  . Thyroid disease    hypo    Past Surgical History:  Procedure Laterality Date  . FRACTURE SURGERY    . RHINOPLASTY    . TONSILLECTOMY    . wisdom teeth removal      Family History  Problem Relation Age of Onset  . Depression Brother    Social History:  reports that  has never smoked. she has never used smokeless tobacco. She reports that she does not drink alcohol or use drugs.  Allergies:  Allergies  Allergen Reactions  . Morphine Swelling and Other (See Comments)    Reaction:  All over body swelling  . Stadol [Butorphanol] Other (See Comments)    Dizziness and blurred vision  . Food Color Red [Red Dye] Itching, Swelling and Other (See Comments)    Reaction:  Lip swelling    Medications Prior to Admission  Medication Sig Dispense Refill  . Prenatal Vit-Fe Fumarate-FA (PRENATAL MULTIVITAMIN) TABS tablet Take 1 tablet by mouth daily at 12 noon.         Blood pressure 111/75, pulse (!) 113, temperature 98.2 F (36.8 C), temperature source Oral, resp. rate 18, last menstrual period 12/28/2016, SpO2 100 %. General appearance: alert and cooperative Abdomen: gravid, non tender   Lab Results  Component Value Date   WBC 16.3 (H) 10/06/2017   HGB 11.1 (L) 10/06/2017   HCT 33.2 (L) 10/06/2017   MCV 79.0 10/06/2017   PLT 219 10/06/2017   Lab Results  Component Value Date   PREGTESTUR POSITIVE (A) 02/21/2017   HCG >2,000.0 (H) 01/31/2017       Patient Active Problem List   Diagnosis Date Noted  . Normal labor 10/06/2017  . Post term pregnancy at  [redacted] weeks gestation 10/06/2017  . Active labor at term 11/25/2012  . SVD (spontaneous vaginal delivery) 11/25/2012   IMP/ IUP at term in labor.         Uncomplicated PNC Plan/ Admit          Expect SVD

## 2017-10-06 NOTE — Progress Notes (Signed)
Pt seen at 21:00 and then again after breastfeeding was done at 21:30    Called about persistent numbness after epidural placed this am, early.   Pt delivered at 0639 and yet still has numbness and difficulty walking. Feels like she's dragging the left foot. She has bladder function without difficulty.  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 It is barely noticeable on the right; however motor strength on the Left is significantly decreased from hip flexion down to foot flexion. There is as well a sensory deficit component L>R.  Reflexes are present and vigorous bilaterally.  I suggest reevaluation in am to see if she is improving and perhaps a neurology consult and OT  Consult if difficulty in ambulation might interfere with new mother tasks.

## 2017-10-06 NOTE — Anesthesia Postprocedure Evaluation (Signed)
Anesthesia Post Note  Patient: Sandra Rich  Procedure(s) Performed: AN AD HOC LABOR EPIDURAL     Patient location during evaluation: Mother Baby Anesthesia Type: Epidural Level of consciousness: awake, awake and alert, oriented and patient cooperative Pain management: pain level controlled Vital Signs Assessment: post-procedure vital signs reviewed and stable Respiratory status: spontaneous breathing, nonlabored ventilation and respiratory function stable Cardiovascular status: stable Postop Assessment: no backache, no headache, epidural receding, no apparent nausea or vomiting and patient able to bend at knees Anesthetic complications: no    Last Vitals:  Vitals:   10/06/17 0945 10/06/17 1040  BP: 119/71 110/65  Pulse: (!) 107 (!) 103  Resp: 20 20  Temp: 36.7 C 37.1 C  SpO2:      Last Pain:  Vitals:   10/06/17 1040  TempSrc: Oral  PainSc: 6    Pain Goal:                 Sandra Rich

## 2017-10-06 NOTE — Progress Notes (Signed)
Patient able to walk to BR and urinate without any problems.  Patient needed minimal assistance in ambulating, taking care of pad, etc.

## 2017-10-06 NOTE — Anesthesia Procedure Notes (Signed)
Epidural Patient location during procedure: OB Start time: 10/06/2017 1:36 AM End time: 10/06/2017 1:43 AM  Staffing Anesthesiologist: Marcene DuosFitzgerald, Maliya Marich, MD Performed: anesthesiologist   Preanesthetic Checklist Completed: patient identified, site marked, surgical consent, pre-op evaluation, timeout performed, IV checked, risks and benefits discussed and monitors and equipment checked  Epidural Patient position: sitting Prep: site prepped and draped and DuraPrep Patient monitoring: continuous pulse ox and blood pressure Approach: midline Location: L4-L5 Injection technique: LOR air  Needle:  Needle type: Tuohy  Needle gauge: 17 G Needle length: 9 cm and 9 Needle insertion depth: 5 cm cm Catheter type: closed end flexible Catheter size: 19 Gauge Catheter at skin depth: 10 cm Test dose: negative  Assessment Events: blood not aspirated, injection not painful, no injection resistance, negative IV test and no paresthesia

## 2017-10-06 NOTE — Progress Notes (Signed)
Pt complained of pain of 6/10.  She wants to finish eating and take a shower before she takes any pain medication.  She will call RN when ready for medication.

## 2017-10-06 NOTE — Progress Notes (Signed)
Pt complaining of numbness in left leg, says she can not walk on it and has no feeling.  Notified Anesthesiologist on call, he is going to evaluate her.

## 2017-10-06 NOTE — MAU Note (Signed)
CTX back to back.  No LOF/VB.  Endorses fetal movement.  Last VE 2 cm.  GBS-

## 2017-10-06 NOTE — Anesthesia Preprocedure Evaluation (Signed)
Anesthesia Evaluation  Patient identified by MRN, date of birth, ID band Patient awake    Reviewed: Allergy & Precautions, Patient's Chart, lab work & pertinent test results  Airway Mallampati: II  TM Distance: >3 FB     Dental   Pulmonary asthma ,    Pulmonary exam normal        Cardiovascular negative cardio ROS Normal cardiovascular exam     Neuro/Psych Anxiety    GI/Hepatic negative GI ROS, Neg liver ROS,   Endo/Other  negative endocrine ROS  Renal/GU negative Renal ROS     Musculoskeletal   Abdominal   Peds  Hematology negative hematology ROS (+)   Anesthesia Other Findings   Reproductive/Obstetrics (+) Pregnancy                             Lab Results  Component Value Date   WBC 16.3 (H) 10/06/2017   HGB 11.1 (L) 10/06/2017   HCT 33.2 (L) 10/06/2017   MCV 79.0 10/06/2017   PLT 219 10/06/2017    Anesthesia Physical Anesthesia Plan  ASA: II  Anesthesia Plan: Epidural   Post-op Pain Management:    Induction:   PONV Risk Score and Plan: Treatment may vary due to age or medical condition  Airway Management Planned: Natural Airway  Additional Equipment:   Intra-op Plan:   Post-operative Plan:   Informed Consent: I have reviewed the patients History and Physical, chart, labs and discussed the procedure including the risks, benefits and alternatives for the proposed anesthesia with the patient or authorized representative who has indicated his/her understanding and acceptance.     Plan Discussed with:   Anesthesia Plan Comments:         Anesthesia Quick Evaluation

## 2017-10-07 LAB — CBC
HCT: 32.6 % — ABNORMAL LOW (ref 36.0–46.0)
HEMOGLOBIN: 10.5 g/dL — AB (ref 12.0–15.0)
MCH: 26 pg (ref 26.0–34.0)
MCHC: 32.2 g/dL (ref 30.0–36.0)
MCV: 80.7 fL (ref 78.0–100.0)
PLATELETS: 237 10*3/uL (ref 150–400)
RBC: 4.04 MIL/uL (ref 3.87–5.11)
RDW: 14.4 % (ref 11.5–15.5)
WBC: 14.8 10*3/uL — ABNORMAL HIGH (ref 4.0–10.5)

## 2017-10-07 LAB — BIRTH TISSUE RECOVERY COLLECTION (PLACENTA DONATION)

## 2017-10-07 NOTE — Lactation Note (Signed)
This note was copied from a baby's chart. Lactation Consultation Note  Patient Name: Sandra Rich RecordsHannah Wigglesworth ZOXWR'UToday's Date: 10/07/2017 Reason for consult: Initial assessment;Nipple pain/trauma;Difficult latch;Maternal endocrine disorder Type of Endocrine Disorder?: Thyroid  Visited with P2 Mom with term baby at 8327 hrs old.  Mom has bilateral nipple soreness, and blistering on right nipple.  Mom not wanting to latch baby onto right side.  RN gave Mom Comfort Gels.  Upon entering room,  Mom sitting upright without support, twisting her body to the right, and baby lying in her lap.  Suggested she unlatch baby and reposition herself and baby.  On oral assessment,  Labial frenulum noted, and short lingual frenulum noted.  Baby lifts tongue to about 1/3 of oral cavity, tongue fairly flat.    Baby latched in football hold on left breast, after two attempts.  Mom remarking on how much more comfortable latch is.  Swallows identified, baby sleepy, and Mom taught how to use alternate breast compression to increase swallows.  Encourage to relax when baby is breastfeeding.  Baby feeding at 20 mins when LC left room.  Mom aware of importance of breaking suction prior to taking baby off.   Mom to ask her RN to set up DEBP, as she wants to avoid feeding baby on right due to trauma and pain.  Mom to pump after breastfeeding, and use breast massage and hand expression to express EBM to feed to baby.  Mom to ask RN for assistance with this.  Lactation brochure given to Mom.  Mom aware of OP and IP lactation support available to her.  Maternal Data Formula Feeding for Exclusion: No Has patient been taught Hand Expression?: Yes Does the patient have breastfeeding experience prior to this delivery?: Yes  Feeding Feeding Type: Breast Fed Length of feed: (baby still feeding after 20 mins)  LATCH Score Latch: Grasps breast easily, tongue down, lips flanged, rhythmical sucking.  Audible Swallowing: A few with  stimulation  Type of Nipple: Everted at rest and after stimulation  Comfort (Breast/Nipple): Filling, red/small blisters or bruises, mild/mod discomfort  Hold (Positioning): Assistance needed to correctly position infant at breast and maintain latch.  LATCH Score: 7  Interventions Interventions: Breast feeding basics reviewed;Assisted with latch;Skin to skin;Breast massage;Hand express;Breast compression;Adjust position;Support pillows;Position options;Expressed milk;Comfort gels;DEBP;Hand pump  Lactation Tools Discussed/Used Tools: Pump;Comfort gels Breast pump type: Double-Electric Breast Pump Pump Review: Setup, frequency, and cleaning;Milk Storage Initiated by:: Erby Pian Ladonne Sharples RN IBCLC Date initiated:: 10/07/17   Consult Status Consult Status: Follow-up Date: 10/08/17 Follow-up type: In-patient    [name], Nahal Wanless E 10/07/2017, 9:55 AM

## 2017-10-07 NOTE — Discharge Summary (Signed)
Obstetric Discharge Summary Reason for Admission: onset of labor Prenatal Procedures: NST Intrapartum Procedures: vacuum Postpartum Procedures: none Complications-Operative and Postpartum: none Hemoglobin  Date Value Ref Range Status  10/07/2017 10.5 (L) 12.0 - 15.0 g/dL Final   HCT  Date Value Ref Range Status  10/07/2017 32.6 (L) 36.0 - 46.0 % Final    Discharge Diagnoses: Term Pregnancy-delivered  Discharge Information: Date: 10/07/2017 Activity: pelvic rest Diet: routine Medications: Ibuprofen Condition: stable Instructions: refer to practice specific booklet Discharge to: home Follow-up Information    Levi AlandAnderson, [name], MD Follow up in 4 week(s).   Specialty:  Obstetrics and Gynecology Contact information: [address] [place] [id number] [phone]           Newborn Data: Live born female  Birth Weight: 6 lb 7.7 oz (2940 g) APGAR: 8, 9  Newborn Delivery   Birth date/time:  10/06/2017 06:39:00 Delivery type:  Vaginal, Vacuum (Extractor)     Home with mother.  Sandra Rich 10/07/2017, 7:27 AM

## 2017-10-07 NOTE — Progress Notes (Addendum)
Patient is eating, ambulating but with some difficulty but voiding on own well.  Pain control is good.  Vitals:   10/06/17 0945 10/06/17 1040 10/06/17 1529 10/07/17 0548  BP: 119/71 110/65 101/75 113/84  Pulse: (!) 107 (!) 103 90 88  Resp: 20 20 20 18   Temp: 98.1 F (36.7 C) 98.8 F (37.1 C) 98.3 F (36.8 C) 98.1 F (36.7 C)  TempSrc: Oral Oral Oral Oral  SpO2:        Fundus firm Perineum without swelling. Ext good strength with lower leg lift bilaterally and pushing down.  Sensation normal.  Significant deficit of pulling up foot on L, normal on R.  Pt c/o some pain in both legs with movement.  Lab Results  Component Value Date   WBC 14.8 (H) 10/07/2017   HGB 10.5 (L) 10/07/2017   HCT 32.6 (L) 10/07/2017   MCV 80.7 10/07/2017   PLT 237 10/07/2017    --/--/B POS (02/14 0100)/RI  A/P Post partum day 1. Pt having some deficit of L foot.  Dr. Malen GauzeFoster examined patient with me.  Will have PT and OT eval pt.  Will consider neurology if not getting better by end of day.  Jeanetta Alonzo A

## 2017-10-07 NOTE — Evaluation (Signed)
Physical Therapy Evaluation Patient Details Name: Sandra Rich MRN: [id number] DOB: 1991-06-08 Today's Date: 10/07/2017   History of Present Illness  Patient is s/p vaginal delivery on 2/14.  Pt had epidural and per her report pushed for ~45 minutes, but with her legs in full hip and knee flexion.  MD does note she was "pushing poorly" due to heavy epidural.  Post delivery, patient with c/o pain and decreased strength in bilateral LE, L>R.  Clinical Impression  Patient presents with dependencies in gait and mobility due to pain and weakness bilateral LE, L>R.  Patient did ambulate slightly better with RW, but does have unusual gait pattern.  Patient will benefit from continued PT to progress ambulation and mobility.  May benefit from continue PT post discharge.  Will follow patient while in hospital.    Follow Up Recommendations Home health PT;Outpatient PT;Supervision for mobility/OOB    Equipment Recommendations  Rolling walker with 5" wheels    Recommendations for Other Services       Precautions / Restrictions Precautions Precautions: Fall      Mobility  Bed Mobility Overal bed mobility: Needs Assistance Bed Mobility: Sidelying to Sit;Sit to Sidelying   Sidelying to sit: Min assist     Sit to sidelying: Min assist General bed mobility comments: assist for LE's due to leg pain and groin pain from delivery  Transfers Overall transfer level: Needs assistance Equipment used: Rolling walker (2 wheeled) Transfers: Sit to/from Stand Sit to Stand: Min guard         General transfer comment: able to power up, painful during transfer  Ambulation/Gait Ambulation/Gait assistance: Min assist Ambulation Distance (Feet): 20 Feet Assistive device: Rolling walker (2 wheeled) Gait Pattern/deviations: Step-to pattern;Decreased stance time - left;Decreased stride length;Decreased weight shift to left;Antalgic Gait velocity: severely decreased   General Gait Details: patient  tended to weight bear on toes of left foot and limit weight bearing on left while advancing right foot.  Unable to ambulate further due to pain and fatigue.   Stairs            Wheelchair Mobility    Modified Rankin (Stroke Patients Only)       Balance Overall balance assessment: Mild deficits observed, not formally tested(deficits due to pain and decreased motor control of LE's)                                           Pertinent Vitals/Pain Pain Assessment: 0-10 Pain Score: 8  Pain Location: left leg Pain Descriptors / Indicators: Aching;Constant;Discomfort;Heaviness Pain Intervention(s): Limited activity within patient's tolerance;Monitored during session    Home Living Family/patient expects to be discharged to:: Private residence Living Arrangements: Alone;Other relatives(will be alone portion of day during the week; should have 24 hour care this weekend) Available Help at Discharge: Family;Available PRN/intermittently           Home Equipment: None      Prior Function Level of Independence: Independent               Hand Dominance        Extremity/Trunk Assessment   Upper Extremity Assessment Upper Extremity Assessment: Overall WFL for tasks assessed    Lower Extremity Assessment Lower Extremity Assessment: RLE deficits/detail;LLE deficits/detail RLE Deficits / Details: knee extension 4/5, ankle dorsiflex 4/5, ankle plantar flexion 4/5, knee flexion - unable to tolerate resistance to knee extension in sitting;  intant to light touch LLE Deficits / Details: unable to fully extend knee or ankle dorsiflex against gravity; intact to light touch       Communication   Communication: No difficulties  Cognition Arousal/Alertness: Awake/alert Behavior During Therapy: WFL for tasks assessed/performed Overall Cognitive Status: Within Functional Limits for tasks assessed                                         General Comments      Exercises     Assessment/Plan    PT Assessment Patient needs continued PT services  PT Problem List Decreased strength;Decreased activity tolerance;Decreased balance;Decreased mobility;Decreased knowledge of use of DME;Pain       PT Treatment Interventions DME instruction;Gait training;Functional mobility training;Neuromuscular re-education;Balance training;Therapeutic activities;Therapeutic exercise;Patient/family education    PT Goals (Current goals can be found in the Care Plan section)  Acute Rehab PT Goals Patient Stated Goal: feel better PT Goal Formulation: With patient Time For Goal Achievement: 10/10/17 Potential to Achieve Goals: Good    Frequency Min 3X/week   Barriers to discharge Decreased caregiver support limited support at home.  Lives with aunt and uncle who both work.    Co-evaluation               AM-PAC PT "6 Clicks" Daily Activity  Outcome Measure Difficulty turning over in bed (including adjusting bedclothes, sheets and blankets)?: A Little Difficulty moving from lying on back to sitting on the side of the bed? : Unable Difficulty sitting down on and standing up from a chair with arms (e.g., wheelchair, bedside commode, etc,.)?: A Lot Help needed moving to and from a bed to chair (including a wheelchair)?: A Little Help needed walking in hospital room?: A Little Help needed climbing 3-5 steps with a railing? : Total 6 Click Score: 13    End of Session   Activity Tolerance: Patient limited by fatigue;Patient limited by pain Patient left: in bed;with call bell/phone within reach Nurse Communication: Mobility status PT Visit Diagnosis: Unsteadiness on feet (R26.81);Other abnormalities of gait and mobility (R26.89);Muscle weakness (generalized) (M62.81)    Time: 1010-1032 PT Time Calculation (min) (ACUTE ONLY): 22 min   Charges:   PT Evaluation $PT Eval Moderate Complexity: 1 Mod     PT G Codes:         10/07/2017 [facility], PT [phone]    Sandra Rich, Sandra Rich 10/07/2017, 11:56 AM

## 2017-10-07 NOTE — Progress Notes (Signed)
MOB was referred for history of anxiety.  Referral is screened out by Clinical Social Worker because none of the following criteria appear to apply and  there are no reports impacting the pregnancy or her transition to the postpartum period. CSW does not deem it clinically necessary to further investigate at this time.  -History of anxiety/depression during this pregnancy, or of post-partum depression.  - Diagnosis of anxiety and/or depression within last 3 years.-  - History of depression due to pregnancy loss/loss of child or -MOB's symptoms are currently being treated with medication and/or therapy.  Please contact the Clinical Social Worker if needs arise or upon MOB request.      Lorrane Allani Reber LCSW, MSW Clinical Social Work: System Wide Float Coverage for :  [phone] 

## 2017-10-08 ENCOUNTER — Ambulatory Visit (HOSPITAL_COMMUNITY): Payer: Medicaid Other

## 2017-10-08 ENCOUNTER — Inpatient Hospital Stay (HOSPITAL_COMMUNITY): Payer: Medicaid Other

## 2017-10-08 DIAGNOSIS — M79604 Pain in right leg: Secondary | ICD-10-CM

## 2017-10-08 DIAGNOSIS — M79605 Pain in left leg: Secondary | ICD-10-CM

## 2017-10-08 DIAGNOSIS — R29898 Other symptoms and signs involving the musculoskeletal system: Secondary | ICD-10-CM

## 2017-10-08 MED ORDER — LORAZEPAM 1 MG PO TABS
1.0000 mg | ORAL_TABLET | Freq: Once | ORAL | Status: AC
  Administered 2017-10-08: 1 mg via ORAL

## 2017-10-08 MED ORDER — LORAZEPAM 2 MG/ML IJ SOLN
1.0000 mg | Freq: Once | INTRAMUSCULAR | Status: DC
  Filled 2017-10-08: qty 0.5

## 2017-10-08 MED ORDER — LORAZEPAM BOLUS VIA INFUSION: 1.0000 mg | Freq: Once | INTRAVENOUS | Status: DC

## 2017-10-08 MED ORDER — LORAZEPAM 2 MG/ML IJ SOLN
1.0000 mg | INTRAMUSCULAR | Status: DC
  Administered 2017-10-08: 1 mg via INTRAVENOUS

## 2017-10-08 MED ORDER — LORAZEPAM 2 MG/ML IJ SOLN
INTRAMUSCULAR | Status: AC
  Filled 2017-10-08: qty 1

## 2017-10-08 NOTE — Evaluation (Signed)
Occupational Therapy Evaluation Patient Details Name: Sandra Rich MRN: [id number] DOB: [birth year] Today's Date: 10/08/2017    History of Present Illness Patient is s/p vaginal delivery on 2/14.  Pt had epidural and per her report pushed for ~45 minutes, but with her legs in full hip and knee flexion.  MD does note she was "pushing poorly" due to heavy epidural.  Post delivery, patient with c/o pain and decreased strength in bilateral LE, L>R.   Clinical Impression   Pt admitted with above. She demonstrates the below listed deficits and will benefit from continued OT to maximize safety and independence with BADLs.  Pt limited with her ability to perform ADLs due to severe pain 10/10 bil. LEs, Lt > Rt and LE weakness.  She was unable to stand with OT despite 3 attempts.  Pt indicates pain 10/10 with all bed mobility , sitting, and any attempt to perform ADLs (some inconsistencies noted - see below).  She lives with aunt and uncle and reports the baby's father will be staying her during the day for at least the next week (she is unsure if he will be staying after that or at nighttime).  At present, feel she will require 24 hour assist at discharge.  Agree with PT recommendations for a w/c.  Also feel she may need 3in1 commode, and tub transfer bench.  Will continue to follow to increase her independence with ADLs as well as problem solve through modifications/adaptations with child care responsibilities.        Follow Up Recommendations  Home health OT;Supervision/Assistance - 24 hour (may not be eligible for services due to payor source).   Equipment Recommendations  3 in 1 bedside commode;Tub/shower bench;Wheelchair (measurements OT)    Recommendations for Other Services       Precautions / Restrictions Precautions Precautions: Fall Restrictions Weight Bearing Restrictions: No      Mobility Bed Mobility Overal bed mobility: Needs Assistance Bed Mobility: Supine to Sit;Sit to  Supine     Supine to sit: Supervision Sit to supine: Min assist   General bed mobility comments: increased time and effort.  Pt crying throughout.  Assisted with moving LEs back onto bed   Transfers Overall transfer level: Needs assistance Equipment used: Rolling walker (2 wheeled) Transfers: Sit to/from Stand Sit to Stand: Min guard;Min assist         General transfer comment: Pt able to move into partial stand, but unable to extend hips and knees x 3 attempts.  c/o severe pain.      Balance Overall balance assessment: Needs assistance   Sitting balance-Leahy Scale: Fair                                     ADL either performed or assessed with clinical judgement   ADL Overall ADL's : Needs assistance/impaired Eating/Feeding: Independent   Grooming: Wash/dry hands;Wash/dry face;Oral care;Brushing hair;Set up;Sitting   Upper Body Bathing: Set up;Sitting   Lower Body Bathing: Bed level;Minimal assistance Lower Body Bathing Details (indicate cue type and reason): Pt unable to stand for me this session  Upper Body Dressing : Set up;Sitting   Lower Body Dressing: Minimal assistance;Bed level Lower Body Dressing Details (indicate cue type and reason): assist to pull pants over hips in supine/long sitting    Toilet Transfer Details (indicate cue type and reason): unable to complete with OT  Toileting- Clothing Manipulation and Hygiene: Maximal assistance;Bed level  General ADL Comments: Pt limited by c/o severe pain bil. LEs and weakness when attempting to stand.       Vision         Perception     Praxis      Pertinent Vitals/Pain Pain Assessment: 0-10 Pain Score: 10-Worst pain ever Pain Location: bil. LEs Lt > Rt  Pain Descriptors / Indicators: Heaviness;Pins and needles;Radiating;Shooting Pain Intervention(s): Repositioned;Monitored during session;Limited activity within patient's tolerance     Hand Dominance Right    Extremity/Trunk Assessment Upper Extremity Assessment Upper Extremity Assessment: Overall WFL for tasks assessed   Lower Extremity Assessment Lower Extremity Assessment: Defer to PT evaluation   Cervical / Trunk Assessment Cervical / Trunk Assessment: Normal   Communication Communication Communication: No difficulties   Cognition Arousal/Alertness: Awake/alert Behavior During Therapy: Anxious Overall Cognitive Status: Within Functional Limits for tasks assessed                                     General Comments  Pt with c/o pain 10/10 with all activity.  Pt frequently crying.   Lactation specialist arrived when pt sitting EOB, and pt able to fully engage with her for ~15 - 20 mins with no grimmacing observed.  Upon returning to supine, assisted pt with removal of pants, pt grimmaced with pants being pulled off Lt LE, but displayed no reaction, or c/o pain when socks were reapplied to bil. feet.    Discussed with pt option of sleeping in recliner as moving supine <> EOB is very painful.  Also discussed limiting activity to w/c when she has increased pain     Exercises     Shoulder Instructions      Home Living Family/patient expects to be discharged to:: Private residence Living Arrangements: Alone;Other relatives Available Help at Discharge: Family;Available PRN/intermittently Type of Home: House             Bathroom Shower/Tub: Chief Strategy Officer: Standard     Home Equipment: None   Additional Comments: Lives with aunt and uncle.  the baby's father will be staying with her during the day for the next week or so.  She states she is unsure if he will be there at night stating, it just depends on circumstances and how things go       Prior Functioning/Environment Level of Independence: Independent        Comments: Pt has 52 yo son, and indicates she was working PTA         OT Problem List: Decreased strength;Decreased activity  tolerance;Impaired balance (sitting and/or standing);Decreased safety awareness;Decreased knowledge of use of DME or AE;Pain      OT Treatment/Interventions: Self-care/ADL training;Neuromuscular education;DME and/or AE instruction;Therapeutic activities;Patient/family education;Balance training    OT Goals(Current goals can be found in the care plan section) Acute Rehab OT Goals Patient Stated Goal: to be able to take care of herself  OT Goal Formulation: With patient Time For Goal Achievement: 10/22/17 Potential to Achieve Goals: Good ADL Goals Pt Will Perform Grooming: (P) with supervision;standing Pt Will Perform Upper Body Bathing: (P) with set-up;sitting Pt Will Perform Lower Body Bathing: (P) with supervision;sit to/from stand Pt Will Perform Upper Body Dressing: (P) with set-up;sitting Pt Will Perform Lower Body Dressing: (P) with supervision;sit to/from stand Pt Will Transfer to Toilet: (P) with supervision;ambulating;regular height toilet;bedside commode;grab bars Pt Will Perform Toileting - Clothing Manipulation and hygiene: (P)  with supervision;sit to/from stand Pt Will Perform Tub/Shower Transfer: (P) Tub transfer;with min guard assist;ambulating;tub bench;rolling walker Additional ADL Goal #1: (P) Pt will demonstrate ability to care for daughter with min A utilizing modifications/adaptations  OT Frequency: Min 2X/week   Barriers to D/C: Decreased caregiver support          Co-evaluation              AM-PAC PT "6 Clicks" Daily Activity     Outcome Measure Help from another person eating meals?: None Help from another person taking care of personal grooming?: A Little Help from another person toileting, which includes using toliet, bedpan, or urinal?: A Lot Help from another person bathing (including washing, rinsing, drying)?: A Lot Help from another person to put on and taking off regular upper body clothing?: A Little Help from another person to put on and  taking off regular lower body clothing?: A Little 6 Click Score: 17   End of Session Equipment Utilized During Treatment: Rolling walker Nurse Communication: Mobility status  Activity Tolerance: Patient limited by pain Patient left: in bed;with call bell/phone within reach  OT Visit Diagnosis: Pain Pain - Right/Left: Left Pain - part of body: Leg                Time: [id number] OT Time Calculation (min): 58 min Charges:  OT General Charges $OT Visit: 1 Visit OT Evaluation $OT Eval Moderate Complexity: 1 Mod OT Treatments $Self Care/Home Management : 38-52 mins G-Codes:     Reynolds AmericanWendi Vineta Carone, OTR/L [phone]   Jeani HawkingConarpe, Waylon Hershey M 10/08/2017, 2:39 PM

## 2017-10-08 NOTE — Progress Notes (Signed)
Vital signs obtained. PIV inserted and ativan IV given per MD request. Patient is in MRI awaiting MRI scan. Patient states she is less anxious at this time. Ativan 1mg  wasted in the pyxis by Ronnell FreshwaterLisa Sharpe, RTRMR.

## 2017-10-08 NOTE — Consult Note (Signed)
NEURO HOSPITALIST CONSULT NOTE   Requestig physician: Dr. Dareen Piano  Reason for Consult: Bilateral lower extremity weakness and pain  History obtained from:  Patient and Chart     HPI:                                                                                                                                          Sandra Rich is an 27 y.o. female who is status post vaginal delivery on 2/14. She had an epidural and per her report she pushed for about 45 minutes but with her legs in full hip and knee flexion. She had been "pushing poorly" per ob/gyn team due to heavy epidural anesthesia. After delivery, the patient complained of pain and decreased strength in her bilateral lower extremities, left worse than right. Today, she continued to have pain and weakness in her lower extremities, left worse than right. She states that her legs feel heavy as though the epidural anesthesia has not fully worn off. She also has pain in both legs from the hips down which is precipitated by both active and passive movement of her legs. Neurology was consulted to assess her leg weakness and pain.   Past Medical History:  Diagnosis Date  . Anxiety   . Asthma    childhood  . Eczema   . Medical history non-contributory   . 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  . Thyroid disease    hypo    Past Surgical History:  Procedure Laterality Date  . FRACTURE SURGERY    . RHINOPLASTY    . TONSILLECTOMY    . wisdom teeth removal      Family History  Problem Relation Age of Onset  . Depression Brother     Social History:  reports that  has never smoked. she has never used smokeless tobacco. She reports that she does not drink alcohol or use drugs.  Allergies  Allergen Reactions  . Morphine Swelling and Other (See Comments)    Reaction:  All over body swelling  . Stadol [Butorphanol] Other (See Comments)    Dizziness and blurred vision  . Food Color Red [Red Dye] Itching, Swelling  and Other (See Comments)    Reaction:  Lip swelling    MEDICATIONS:  Scheduled: . ibuprofen  600 mg Oral Q6H  . LORazepam      . LORazepam  1 mg Intravenous Once  . LORazepam  1 mg Intravenous Q4H   Continuous:  UJW:[id number]:acetaminophen, benzocaine-Menthol, coconut oil, witch hazel-glycerin **AND** dibucaine, ondansetron **OR** ondansetron (ZOFRAN) IV, oxyCODONE, simethicone   ROS:                                                                                                                                       As per HPI   Blood pressure 120/77, pulse 79, temperature 97.7 F (36.5 C), temperature source Oral, resp. rate 16, height 5' (1.524 m), weight 61.7 kg (136 lb), last menstrual period 12/28/2016, SpO2 98 %, unknown if currently breastfeeding.   General Examination:                                                                                                      Physical Exam  HEENT-   [place]/AT   Lungs-Respirations unlabored Extremities- No edema. Warm and well perfused.  Neurological Examination Mental Status: Alert, oriented, thought content appropriate.  Speech fluent without evidence of aphasia.  Able to follow all commands without difficulty. Becomes tearful and frustrated by the wait for cervical and thoracic spine MRI after completion of MRI lumbar spine. Has an unconcerned affect when interviewed about her weakness initially, but becomes anxious and tearful at times during the assessment. Became oppositional with staff and [facility] further testing after being told that cervical and thoracic spine was ordered, despite being told that if the etiology for her weakness was structural and surgically or medically correctable it could result in permanent disability.  Cranial Nerves: II: Visual fields intact  with no extinction to DSS. PERRL.   III,IV, VI: EOMI without nystagmus.  V,VII: No facial droop. Temp sensation equal bilaterally. VIII: Hearing intact to voice.  IX,X: Palate rises symmetrically XI: Symmetric shoulder shrug XII: midline tongue extension Motor: Poor effort upper extremities with maximum elicited strength 4+/5 bilaterally. Tone and bulk normal.  Lower extremities with apparent weakness to flexion at hips and extension at knees and ankles, with little to no movement when asked to move volitionally, but extends forcefully downwards at hips with 5/5 strength when examiner attempts to passively elevate legs; similar reaction to attempted flexion at knees, resisting with 5/5 strength bilaterally. When plantar irritative stimulation is initially applied without warning, she withdraws briskly initially, then keeps legs extended without  further movement to further plantar stimulation.  Sensory: Subjectively intact FT to mid calves, with subjectively decreased sensation distally in a circumferential nondermatomal distribution. Similar deficit to temp sensation in the same distribution.  Deep Tendon Reflexes: 3+ bilateral brachioradialis and biceps. 3 to 4+ patellar and achilles reflexes bilaterally with adduction of contralateral extremity on some trials.  Plantars: Right: downgoing  Left: downgoing Cerebellar: No ataxia with FNF bilaterally. States she is unable to move her lower extremities.  Gait: Unable to assess.    Lab Results: Basic Metabolic Panel: No results for input(s): NA, K, CL, CO2, GLUCOSE, BUN, CREATININE, CALCIUM, MG, PHOS in the last 168 hours.  CBC: Recent Labs  Lab 10/06/17 0100 10/07/17 0517  WBC 16.3* 14.8*  HGB 11.1* 10.5*  HCT 33.2* 32.6*  MCV 79.0 80.7  PLT 219 237    Cardiac Enzymes: No results for input(s): CKTOTAL, CKMB, CKMBINDEX, TROPONINI in the last 168 hours.  Lipid Panel: No results for input(s): CHOL, TRIG, HDL, CHOLHDL, VLDL, LDLCALC in  the last 161 hours.  Imaging: Mr Cervical Spine Wo Contrast  Result Date: 10/08/2017 CLINICAL DATA:  Bilateral leg numbness and tingling, worse on the left. Left foot drop. Childbirth on 10/06/2017. EXAM: MRI CERVICAL SPINE WITHOUT CONTRAST TECHNIQUE: Multiplanar, multisequence MR imaging of the cervical spine was performed. No intravenous contrast was administered. COMPARISON:  None. FINDINGS: Axial images are mildly motion degraded. Alignment: Straightening/slight reversal of the normal cervical lordosis. No listhesis. Vertebrae: Diminished bone marrow signal intensity diffusely which may relate to patient's known anemia. No focal osseous lesion, fracture, or significant marrow edema. Cord: Normal signal and morphology. Posterior Fossa, vertebral arteries, paraspinal tissues: Unremarkable. Disc levels: Disc space heights are preserved throughout the cervical spine without a disc herniation identified. The spinal canal and neural foramina are widely patent. IMPRESSION: 1. Normal cervical and upper thoracic spinal cord. 2. No disc herniation or evidence of neural impingement. Electronically Signed   By: Sebastian Ache M.D.   On: 10/08/2017 20:32   Mr Thoracic Spine Wo Contrast  Result Date: 10/08/2017 CLINICAL DATA:  Bilateral leg numbness and tingling, worse on the left. Left foot drop. Childbirth on 10/06/2017 EXAM: MRI THORACIC SPINE WITHOUT CONTRAST TECHNIQUE: Multiplanar, multisequence MR imaging of the thoracic spine was performed. No intravenous contrast was administered. COMPARISON:  None. FINDINGS: Alignment:  Normal. Vertebrae: Diminished bone marrow signal intensity diffusely which may relate to patient's known anemia. No focal osseous lesion, fracture, or significant marrow edema. Cord:  Normal signal and morphology. Paraspinal and other soft tissues: Unremarkable. Disc levels: At most minimal lower thoracic spondylosis. Minimal degenerative fatty marrow changes along the T10 superior endplate.  Preserved disc height and hydration throughout the thoracic spine without disc herniation identified. Widely patent spinal canal and neural foramina. IMPRESSION: 1. Normal thoracic spinal cord. 2. No disc herniation or evidence of neural impingement. Electronically Signed   By: Sebastian Ache M.D.   On: 10/08/2017 20:59   Mr Lumbar Spine Wo Contrast  Result Date: 10/08/2017 CLINICAL DATA:  Bilateral leg numbness and tingling. Recent childbirth. EXAM: MRI LUMBAR SPINE WITHOUT CONTRAST TECHNIQUE: Multiplanar, multisequence MR imaging of the lumbar spine was performed. No intravenous contrast was administered. COMPARISON:  None. FINDINGS: Segmentation: Assumed standard. The last well-formed disc space is labeled L5-S1. Alignment:  Physiologic. Vertebrae:  No fracture, evidence of discitis, or bone lesion. Conus medullaris and cauda equina: Conus extends to the L1 level. Conus and cauda equina appear normal. Paraspinal and other soft tissues: Enlarged, postpartum appearance of  the uterus. Otherwise negative. Disc levels: T10-T11 to L4-L5: Negative. L5-S1: Disc desiccation and small diffuse disc bulge. Mild bilateral facet arthropathy. No stenosis. IMPRESSION: 1. Mild degenerative disc disease at L5-S1. No stenosis or impingement. Electronically Signed   By: Obie Dredge M.D.   On: 10/08/2017 16:03   Assessment: 27 year old female with lower extremity weakness and pain following vaginal delivery 1. Exam reveals several non-physiological inconsistencies as described in the Neurological Exam section.  2. Labile affect. At times with dramatic tearful/anxious/pained affect alternating with indifference.  3. Lumbar spine without intramedullary conus medullaris lesion, conus/cauda equina compression or nerve root impingement. Small L5-S1 disc protrusion noted.  4. Cervical and thoracic spine imaging performed after Lumbar spine also reveals no intrinsic cord lesion or extrinsic cord compression.  5. Traction on  nerve roots or residual effects of compression of lumbosacral plexus from the delivery is on DDx, but unlikely given lack of flaccidity in lower extremities and non-physiological distribution of sensory changes below mid-calves bilaterally.  6. Based upon the imaging studies above, there is no structural lesion to explain her presentation  Recommendations: 1. Continue to monitor.  2. Pain medications per Ob/Gyn service 3. Frequent neuro checks 4. Bladder scan 5. PT/OT 6. Consider psychology consult for possible psychosocial stressors 7. Re-consult Anesthesia to determine if, based upon their experience or the anesthesia literature, there is possibility of epidural anesthetic not having fully worn off 2 days out from her vaginal delivery   Electronically signed: Dr. Caryl Pina 10/08/2017, 9:27 PM

## 2017-10-08 NOTE — Progress Notes (Signed)
Patient is eating, ambulating still with difficulty but is getting up, voiding.  Pain control is worse today.   She showered today but nearly fell (she was caught by an assist) from pain shooting in both legs.  She says L leg is worse- feels like it is going from calf to back of upper leg to buttocks.  She walked today for PT but did not walk for OT.    Vitals:   10/07/17 0548 10/07/17 1839 10/07/17 2100 10/08/17 0621  BP: 113/84 114/72  115/85  Pulse: 88 68  96  Resp: 18 18  18   Temp: 98.1 F (36.7 C) 98 F (36.7 C)  97.7 F (36.5 C)  TempSrc: Oral Oral  Oral  SpO2:      Weight:   136 lb (61.7 kg)   Height:   5' (1.524 m)     Fundus firm Perineum without swelling.  Lab Results  Component Value Date   WBC 14.8 (H) 10/07/2017   HGB 10.5 (L) 10/07/2017   HCT 32.6 (L) 10/07/2017   MCV 80.7 10/07/2017   PLT 237 10/07/2017    --/--/B POS (02/14 0100)/RI  A/P Post partum day 2. Pt still with some weakness in legs and L foot drop.  Now pain is worse.  PT and OT are working with patient.  Will have both set up for outpatient.  However, she is not able to be d/ced at this time.  Will have neurology consult for further help.     Vernon Maish A

## 2017-10-08 NOTE — Lactation Note (Signed)
This note was copied from a baby's chart. Lactation Consultation Note  Patient Name: Girl Fay RecordsHannah Lerch ZOXWR'UToday's Date: 10/08/2017 Reason for consult: Follow-up assessment;Term;Nipple pain/trauma;Difficult latch Type of Endocrine Disorder?: Thyroid  54 hours female who is being exclusively bottle fed breastmilk. Mom is no longer putting baby at the breast due to sore nipples and general discomfort. PT was working with her when entering the room (went there twice). Mom can barely walk and she stated that feedings at the breast (even with the NS) were too painful and she'd rather pump than give her baby formula. Praised mom for her efforts of providing breastmilk for her baby. Mom started to pump volume today, she got 45 ml on her last pumping session. She has a history of oversupply with her first child, she got mastitis.  Reviewed engorgement prevention and treatment, encouraged mom to keep pumping consistently at least 8 times/24 hours (including once at night) and feed baby her breastmilk. Reviewed breastmilk storage guidelines and treatment for sore nipples. [facility]'re unable to [facility] her with a pump; she doesn't have one at home. [facility] and she'll let her nurse know before her discharge. Stressed to mom the importance of having a pump at home if she's going to be exclusively pumping.  Mom is aware of LC services and will contact as OP when needed.  Feeding Feeding Type: Bottle Fed - Breast Milk  Interventions Interventions: Breast feeding basics reviewed;Breast massage;Breast compression;DEBP;Coconut oil;Expressed milk  Lactation Tools Discussed/Used Tools: Pump Nipple shield size: (Mom is no longer putting baby at the breast or using NS) Breast pump type: Double-Electric Breast Pump   Consult Status Consult Status: PRN    Ravin Denardo S Ysabel Stankovich 10/08/2017, 1:28 PM

## 2017-10-08 NOTE — Progress Notes (Signed)
Physical Therapy Treatment Patient Details Name: Sandra Rich MRN: [id number] DOB: 06-Feb-1991 Today's Date: 10/08/2017    History of Present Illness Patient is s/p vaginal delivery on 2/14.  Pt had epidural and per her report pushed for ~45 minutes, but with her legs in full hip and knee flexion.  MD does note she was "pushing poorly" due to heavy epidural.  Post delivery, patient with c/o pain and decreased strength in bilateral LE, L>R.    PT Comments    Pt continues to have pain and weakness in LE's. LLE worse than RLE. Pt could benefit from HHPT but not eligible due to having Medicaid. Pt also not able to receive OPPT due to Medicaid status.  Recommend rolling walker for home for household amb and recommend w/c for mobility when pt needs to assist baby. Pt reports being worried about nighttime. Asked pt if she had a bassinet so baby could sleep close by.  Reports there will be family home at night but that they will be sleeping. Pt likely will need family to assist her with baby's care at home initially.    Follow Up Recommendations  Home health PT(Likely pt not eligible for f/u therapy due to medicaid status)     Equipment Recommendations  Rolling walker with 5" wheels;Wheelchair (measurements PT)    Recommendations for Other Services       Precautions / Restrictions Precautions Precautions: Fall Restrictions Weight Bearing Restrictions: No    Mobility  Bed Mobility Overal bed mobility: Needs Assistance Bed Mobility: Supine to Sit;Sit to Supine     Supine to sit: Supervision Sit to supine: Min assist   General bed mobility comments: Incr time and effort to come into sitting but no physical assist. Assist to bring feet back up into bed returning to supine  Transfers Overall transfer level: Needs assistance Equipment used: Rolling walker (2 wheeled) Transfers: Sit to/from Stand Sit to Stand: Min guard;Min assist         General transfer comment: Assist to  bring hips up from low commode. Assist for safety to stand from higher surface.  Ambulation/Gait Ambulation/Gait assistance: Min assist;Min guard Ambulation Distance (Feet): 60 Feet Assistive device: Rolling walker (2 wheeled) Gait Pattern/deviations: Step-to pattern;Decreased stance time - left;Decreased weight shift to left;Antalgic;Decreased step length - right;Decreased step length - left Gait velocity: severely decreased Gait velocity interpretation: Below normal speed for age/gender General Gait Details: Pt with heavy reliance on UE's during gait. Very little weight bearing on LLE   Stairs            Wheelchair Mobility    Modified Rankin (Stroke Patients Only)       Balance Overall balance assessment: Mild deficits observed, not formally tested(deficits due to pain and decreased motor control of LE's)                                          Cognition Arousal/Alertness: Awake/alert Behavior During Therapy: WFL for tasks assessed/performed Overall Cognitive Status: Within Functional Limits for tasks assessed                                        Exercises      General Comments        Pertinent Vitals/Pain Pain Assessment: 0-10 Pain Score: 9  Pain Location:  left leg Pain Descriptors / Indicators: Heaviness;Shooting Pain Intervention(s): Limited activity within patient's tolerance;Monitored during session;Repositioned    Home Living                      Prior Function            PT Goals (current goals can now be found in the care plan section) Progress towards PT goals: Progressing toward goals    Frequency    Min 3X/week      PT Plan Current plan remains appropriate    Co-evaluation              AM-PAC PT "6 Clicks" Daily Activity  Outcome Measure  Difficulty turning over in bed (including adjusting bedclothes, sheets and blankets)?: A Little Difficulty moving from lying on back to  sitting on the side of the bed? : A Lot Difficulty sitting down on and standing up from a chair with arms (e.g., wheelchair, bedside commode, etc,.)?: A Lot Help needed moving to and from a bed to chair (including a wheelchair)?: A Little Help needed walking in hospital room?: A Little Help needed climbing 3-5 steps with a railing? : Total 6 Click Score: 14    End of Session   Activity Tolerance: Patient limited by pain Patient left: in bed;with call bell/phone within reach Nurse Communication: Mobility status PT Visit Diagnosis: Unsteadiness on feet (R26.81);Other abnormalities of gait and mobility (R26.89);Muscle weakness (generalized) (M62.81)     Time: [id number] PT Time Calculation (min) (ACUTE ONLY): 26 min  Charges:  $Gait Training: 23-37 mins                    G Codes:       [facility] Yung PT [phone]    Angelina OkCary W [facility] 10/08/2017, 12:03 PM

## 2017-10-09 DIAGNOSIS — Z739 Problem related to life management difficulty, unspecified: Secondary | ICD-10-CM

## 2017-10-09 DIAGNOSIS — Z818 Family history of other mental and behavioral disorders: Secondary | ICD-10-CM

## 2017-10-09 DIAGNOSIS — Z008 Encounter for other general examination: Secondary | ICD-10-CM

## 2017-10-09 DIAGNOSIS — Z3A41 41 weeks gestation of pregnancy: Secondary | ICD-10-CM

## 2017-10-09 DIAGNOSIS — F4320 Adjustment disorder, unspecified: Secondary | ICD-10-CM

## 2017-10-09 MED ORDER — BREAST MILK
ORAL | Status: DC
  Filled 2017-10-09: qty 1

## 2017-10-09 NOTE — Progress Notes (Signed)
D/w Anesthesia that the likelihood of continued epidural anesthesia at 2 days is essentially zero.

## 2017-10-09 NOTE — Consult Note (Signed)
[facility] Face-to-Face Psychiatry Consult   Reason for Consult:  ''Patient does vacillate between extreme anxiety and indifference.'' Referring Physician:  Dr. Henderson Cloud Patient Identification: Sandra Rich MRN:  [id number] Principal Diagnosis: Adjustment disorder, unspecified Diagnosis:   Patient Active Problem List   Diagnosis Date Noted  . Adjustment disorder, unspecified [F43.20] 10/09/2017  . Normal labor [O80, Z37.9] 10/06/2017  . Post term pregnancy at [redacted] weeks gestation [O7.0, Z3A.41] 10/06/2017  . Pregnancy [Z34.90] 10/06/2017  . Active labor at term [IMO0001] 11/25/2012  . SVD (spontaneous vaginal delivery) [O80] 11/25/2012    Total Time spent with patient: 45 minutes  Subjective:   Sandra Rich is a 27 y.o. female patient admitted for baby delivery.   HPI:   Thanks for asking me to do a psychiatric consult on Sandra Rich, a 27 y.o. female who is status post vaginal delivery on 2/14. Per patient and chart reports, patient had an epidural and  pushed for about 45 minutes during labor but with her legs in full hip and knee flexion. Post delivery, patient compliant of pain and decreased strength in bilateral LE, L>R. Patient was interviewed with her permission in the presence of her baby's father. She denies prior history of mental illness or any stressor in her life prior to this delivery. Patient denies any financial issue, relationship problem and categorically denies any mental illness. She reports that decreased pain and improving strength in her lower extremities since she started physical therapy. Patient denies prior history of post partum depression.  Past Psychiatric History: none reported  Risk to Self: Is patient at risk for suicide?: No Risk to Others:   Prior Inpatient Therapy:   Prior Outpatient Therapy:    Past Medical History:  Past Medical History:  Diagnosis Date  . Anxiety   . Asthma    childhood  . Eczema   . Medical history non-contributory    . 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  . Thyroid disease    hypo    Past Surgical History:  Procedure Laterality Date  . FRACTURE SURGERY    . RHINOPLASTY    . TONSILLECTOMY    . wisdom teeth removal     Family History:  Family History  Problem Relation Age of Onset  . Depression Brother    Family Psychiatric  History:  Social History:  Social History   Substance and Sexual Activity  Alcohol Use No   Comment: occ     Social History   Substance and Sexual Activity  Drug Use No    Social History   Socioeconomic History  . Marital status: Single    Spouse name: None  . Number of children: None  . Years of education: None  . Highest education level: None  Social Needs  . Financial resource strain: None  . Food insecurity - worry: None  . Food insecurity - inability: None  . Transportation needs - medical: None  . Transportation needs - non-medical: None  Occupational History  . None  Tobacco Use  . Smoking status: Never Smoker  . Smokeless tobacco: Never Used  Substance and Sexual Activity  . Alcohol use: No    Comment: occ  . Drug use: No  . Sexual activity: Not Currently  Other Topics Concern  . None  Social History Narrative  . None   Additional Social History:    Allergies:   Allergies  Allergen Reactions  . Morphine Swelling and Other (See Comments)    Reaction:  All over  body swelling  . Stadol [Butorphanol] Other (See Comments)    Dizziness and blurred vision  . Food Color Red [Red Dye] Itching, Swelling and Other (See Comments)    Reaction:  Lip swelling    Labs: No results found for this or any previous visit (from the past 48 hour(s)).  Current Facility-Administered Medications  Medication Dose Route Frequency Provider Last Rate Last Dose  . acetaminophen (TYLENOL) tablet 650 mg  650 mg Oral Q4H PRN [name], MD   650 mg at 10/08/17 2222  . benzocaine-Menthol (DERMOPLAST) 20-0.5 % topical spray 1 application  1 application  Topical PRN [name], MD   1 application at 10/06/17 1029  . coconut oil  1 application Topical PRN [name], MD   1 application at 10/07/17 [id number]  . witch hazel-glycerin (TUCKS) pad 1 application  1 application Topical PRN [name], MD       And  . dibucaine (NUPERCAINAL) 1 % rectal ointment 1 application  1 application Rectal PRN [name], MD      . ibuprofen (ADVIL,MOTRIN) tablet 600 mg  600 mg Oral Q6H [name], MD   600 mg at 10/09/17 1153  . LORazepam (ATIVAN) injection 1 mg  1 mg Intravenous Once Caryl Pina, MD      . LORazepam (ATIVAN) injection 1 mg  1 mg Intravenous Q4H Carrington Clamp, MD   1 mg at 10/08/17 1829  . ondansetron (ZOFRAN) tablet 4 mg  4 mg Oral Q4H PRN [name], MD       Or  . ondansetron [facility]) injection 4 mg  4 mg Intravenous Q4H PRN [name], MD      . oxyCODONE (Oxy IR/ROXICODONE) immediate release tablet 5 mg  5 mg Oral Q6H PRN Marlow Baars, MD   5 mg at 10/09/17 0711  . simethicone (MYLICON) chewable tablet 80 mg  80 mg Oral PRN [name], MD        Musculoskeletal: Strength & Muscle Tone: not tested Gait & Station: not tested Patient leans: N/A  Psychiatric Specialty Exam: Physical Exam  Psychiatric: She has a normal mood and affect. Her speech is normal and behavior is normal. Judgment and thought content normal. Cognition and memory are normal.    Review of Systems  Constitutional: Negative.   HENT: Negative.   Eyes: Negative.   Respiratory: Negative.   Cardiovascular: Negative.   Gastrointestinal: Negative.   Genitourinary: Negative.   Skin: Negative.   Neurological: Negative.   Endo/Heme/Allergies: Negative.   Psychiatric/Behavioral: Negative.     Blood pressure 119/77, pulse 85, temperature 97.9 F (36.6 C), temperature source Oral, resp. rate 16, height 5' (1.524 m), weight 61.7 kg (136 lb), last menstrual period 12/28/2016, SpO2 98 %, unknown if currently  breastfeeding.Body mass index is 26.56 kg/m.  General Appearance: Casual  Eye Contact:  Good  Speech:  Clear and Coherent  Volume:  Normal  Mood:  Euthymic  Affect:  Appropriate  Thought Process:  Coherent and Descriptions of Associations: Intact  Orientation:  Full (Time, Place, and Person)  Thought Content:  Logical  Suicidal Thoughts:  No  Homicidal Thoughts:  No  Memory:  Immediate;   Good Recent;   Good Remote;   Good  Judgement:  Intact  Insight:  Fair  Psychomotor Activity:  Normal  Concentration:  Concentration: Good and Attention Span: Good  Recall:  Good  Fund of Knowledge:  Good  Language:  Good  Akathisia:  No  Handed:  Right  AIMS (if indicated):     Assets:  Communication Skills Social Support  ADL's:  Intact  Cognition:  WNL  Sleep:   good     Treatment Plan Summary: 27 year old woman who is  s/p vaginal delivery on 2/14.  Pt had epidural and per her report pushed for ~45 minutes, but with her legs in full hip and knee flexion and post delivery, patient complaint of pain and decreased strength in bilateral LE, L>R. However, she denies any psychiatric symptoms such as anxiety, depression, psychosis, delusions, suicidal or infanticide thoughts. At this time, I have no evidence to connect patient's lower extremity pain/weakness with any psychological issues.  Recommendation: -PT-as patient reports some improvement. -Explore other medical causes that could precipitate LE pain/weakness post delivery. -Re-consult psych services if necessary in future.  Disposition: No evidence of imminent risk to self or others at present.    Thedore MinsAkintayo, [place], MD 10/09/2017 1:59 PM

## 2017-10-09 NOTE — Progress Notes (Signed)
Physical Therapy Treatment Patient Details Name: Sandra Rich RecordsHannah Fedorko MRN: [id number] DOB: 1991-04-24 Today's Date: 10/09/2017    History of Present Illness Patient is s/p vaginal delivery on 2/14.  Pt had epidural and per her report pushed for ~45 minutes, but with her legs in full hip and knee flexion.  MD does note she was "pushing poorly" due to heavy epidural.  Post delivery, patient with c/o pain and decreased strength in bilateral LE, L>R.    PT Comments    Patient reports overnight significant pain in Lt>Rt leg and nursing had to use Stedy lift to assist her to the bathroom because she could not support her weight on her legs. Today she was moving her legs on/off the bed by herself and generally walked with minguard assist with RW, however did have 2 episodes of legs slowly "buckling" due to pain where she required +2 assist to prevent fall and return to standing upright. At other times, she was able to put full weight on each leg while opening/closing toothpaste (no weight on her hands). Anticipate she will continue to make good progress with continued positive reinforcement. Continue to recommend the below for discharge.    Follow Up Recommendations  Home health PT(Likely pt not eligible for f/u therapy due to medicaid statu)     Equipment Recommendations  Rolling walker with 5" wheels;Wheelchair (measurements PT)    Recommendations for Other Services       Precautions / Restrictions Precautions Precautions: Fall Restrictions Weight Bearing Restrictions: No    Mobility  Bed Mobility Overal bed mobility: Modified Independent Bed Mobility: Supine to Sit;Sit to Supine     Supine to sit: Modified independent (Device/Increase time) Sit to supine: Min assist;Modified independent (Device/Increase time)   General bed mobility comments: Incr time and effort to come into sitting but no physical assist. Able to lift her RLE onto bed and used her UEs to assist LLE onto  bed  Transfers Overall transfer level: Needs assistance Equipment used: Rolling walker (2 wheeled) Transfers: Sit to/from Stand Sit to Stand: Min guard;Min assist         General transfer comment: vc for safe use of RW (pt able to push off the mattress with bil UEs); limited use of LLE  Ambulation/Gait Ambulation/Gait assistance: Min guard;Mod assist;+2 safety/equipment;+2 physical assistance Ambulation Distance (Feet): 66 Feet Assistive device: Rolling walker (2 wheeled) Gait Pattern/deviations: Step-to pattern;Decreased stance time - left;Decreased weight shift to left;Antalgic;Step-through pattern;Decreased step length - left(poor foot clearance) Gait velocity: severely decreased; max encouragement; frequent standing rest breaks Gait velocity interpretation: <1.8 ft/sec, indicative of risk for recurrent falls General Gait Details: Pt with heavy reliance on UE's during gait. Required max verbal cues with OT providing target for pt to move her left foot towards for incr step length. She occasionally would begin to slowly buckle bil knees with crying out--twice she needed +2 assist to return to standing upright; multiple other episodes she could respond to cues to push down on RW with bil UEs and stand tall on RLE.    Stairs            Wheelchair Mobility    Modified Rankin (Stroke Patients Only)       Balance Overall balance assessment: Mild deficits observed, not formally tested(deficits due to pain and decreased motor control of LE's)   Sitting balance-Leahy Scale: Good  Cognition Arousal/Alertness: Awake/alert Behavior During Therapy: WFL for tasks assessed/performed;Anxious(tearful at times; could quickly redirect) Overall Cognitive Status: Within Functional Limits for tasks assessed                                        Exercises      General Comments General comments (skin integrity,  edema, etc.): Patient observed during ADLs to press her left foot against foot board with no reaction of pain or distress. Inconsistencies with use of UEs to support herself on the RW and with ability to weight-bear on LLE (at sink she did bi-manual task with shifting full weight onto LLE when lifting RLE in pain.       Pertinent Vitals/Pain Pain Assessment: 0-10 Pain Score: 5  Pain Location: bil. LEs Lt > Rt  Pain Descriptors / Indicators: Shooting;Pins and needles Pain Intervention(s): Repositioned;Monitored during session    Home Living                      Prior Function            PT Goals (current goals can now be found in the care plan section) Acute Rehab PT Goals Patient Stated Goal: to be able to take care of herself  Time For Goal Achievement: 10/10/17 Progress towards PT goals: Progressing toward goals    Frequency    Min 3X/week      PT Plan Current plan remains appropriate    Co-evaluation PT/OT/SLP Co-Evaluation/Treatment: Yes Reason for Co-Treatment: Necessary to address cognition/behavior during functional activity;For patient/therapist safety PT goals addressed during session: Mobility/safety with mobility;Proper use of DME        AM-PAC PT "6 Clicks" Daily Activity  Outcome Measure  Difficulty turning over in bed (including adjusting bedclothes, sheets and blankets)?: None Difficulty moving from lying on back to sitting on the side of the bed? : None Difficulty sitting down on and standing up from a chair with arms (e.g., wheelchair, bedside commode, etc,.)?: A Little Help needed moving to and from a bed to chair (including a wheelchair)?: A Little Help needed walking in hospital room?: A Little Help needed climbing 3-5 steps with a railing? : Total 6 Click Score: 18    End of Session Equipment Utilized During Treatment: (chose not to use gait belt to reduce psychological dependenc) Activity Tolerance: Patient limited by pain;Patient  limited by fatigue Patient left: in bed;with call bell/phone within reach;with family/visitor present Nurse Communication: Mobility status PT Visit Diagnosis: Unsteadiness on feet (R26.81);Other abnormalities of gait and mobility (R26.89);Muscle weakness (generalized) (M62.81)     Time: 1150-1240 PT Time Calculation (min) (ACUTE ONLY): 50 min  Charges:  $Gait Training: 23-37 mins                    G Codes:           [employer], PT 10/09/2017, 1:37 PM

## 2017-10-09 NOTE — Progress Notes (Addendum)
Patient is eating, still ambulating, voiding on own.  Pain control is fair- feeling more like pins and needles on R today.    Vitals:   10/08/17 0621 10/08/17 1828 10/08/17 2142 10/09/17 0500  BP: 115/85 120/77 104/74 119/77  Pulse: 96 79 98 85  Resp: 18 16 16 16   Temp: 97.7 F (36.5 C)  97.8 F (36.6 C) 97.9 F (36.6 C)  TempSrc: Oral  Oral Oral  SpO2:  98%    Weight:      Height:        Fundus firm Perineum without swelling.  Lab Results  Component Value Date   WBC 14.8 (H) 10/07/2017   HGB 10.5 (L) 10/07/2017   HCT 32.6 (L) 10/07/2017   MCV 80.7 10/07/2017   PLT 237 10/07/2017    --/--/B POS (02/14 0100)/RI  A/P Post partum day 3.  Pt seen by neuro yesterday and had entire spine imaged.  MRI of cervical, thoracic, and lumbar spine all normal and without defects to explain pt's weakness and pain.  Per Neuro: Recommendations: 1. Continue to monitor.  2. Pain medications per Ob/Gyn service 3. Frequent neuro checks 4. Bladder scan 5. PT/OT 6. Consider psychology consult for possible psychosocial stressors 7. Re-consult Anesthesia to determine if, based upon their experience or the anesthesia literature, there is possibility of epidural anesthetic not having fully worn off 2 days out from her vaginal delivery  Will d/w anesthesia their thoughts.  Pt does have affect described in Neuro's note- alternating between extreme anxiety and pain response with indifference and no pain response.  Agree to get psychology consult to help with mental state.    Given that pain and pins and needles feeling increased, it may be likely that there was an aggravation of peripheral nerves that is now beginning to resolve.    Tashika Goodin A

## 2017-10-09 NOTE — Progress Notes (Signed)
Occupational Therapy Treatment Patient Details Name: Sandra Rich MRN: [id number] DOB: 02-18-1991 Today's Date: 10/09/2017    History of present illness Patient is s/p vaginal delivery on 2/14.  Pt had epidural and per her report pushed for ~45 minutes, but with her legs in full hip and knee flexion.  MD does note she was "pushing poorly" due to heavy epidural.  Post delivery, patient with c/o pain and decreased strength in bilateral LE, L>R.   OT comments  Pt seen in conjunction with PT today as pt requiring +2 for safety as she will slowly "buckle" knees and move precariously low toward the floor in response to pain while ambulating.  She was in noticeably less pain today, and does report that pain is improving.  She is able to perform bed level dressing with supervision, and She was able shift weight fully between Lt and Rt LE while standing at sink to brush teeth without UE support.   She requires min guard to occasional min A +2 for functional mobility.  She now indicates that she will have assist with newborn care during the days, and she is working on a plan for nights.  Her 25 y.o will be cared for by his father and grandparents.   Will continue for follow.   Recommend HHOT.   Follow Up Recommendations  Home health OT;Supervision/Assistance - 24 hour    Equipment Recommendations  3 in 1 bedside commode;Tub/shower bench;Wheelchair (measurements OT)    Recommendations for Other Services      Precautions / Restrictions Precautions Precautions: Fall Restrictions Weight Bearing Restrictions: No       Mobility Bed Mobility Overal bed mobility: Modified Independent Bed Mobility: Supine to Sit;Sit to Supine     Supine to sit: Modified independent (Device/Increase time) Sit to supine: Min guard   General bed mobility comments: Pt able to lift bil. LEs onto bed - pt grimacing today, but no crying with this activity.  she required Incr time and effort to come into sitting but no  physical assist. Able to lift her RLE onto bed and used her UEs to assist LLE onto bed  Transfers Overall transfer level: Needs assistance Equipment used: Rolling walker (2 wheeled) Transfers: Sit to/from Stand Sit to Stand: Min guard;Min assist         General transfer comment: vc for safe use of RW (pt able to push off the mattress with bil UEs); limited use of LLE    Balance Overall balance assessment: Mild deficits observed, not formally tested(deficits due to pain and decreased motor control of LE's)   Sitting balance-Leahy Scale: Good                                     ADL either performed or assessed with clinical judgement   ADL Overall ADL's : Needs assistance/impaired Eating/Feeding: Independent   Grooming: Wash/dry hands;Oral care;Min guard;Standing Grooming Details (indicate cue type and reason): Pt noted to fully weight bear through each Rt LE and Lt LE while using bil. hands to open toothpaste container and apply paste to toothbrush              Lower Body Dressing: Set up;Bed level Lower Body Dressing Details (indicate cue type and reason): Pt able to don pants and socks in long sitting.  She bridged and rolled to pull pants over hips.  Pt grimacing at time, but activity required noticably less effort  with less crying today - pt indicates less pain today  Toilet Transfer: Minimal assistance;+2 for safety/equipment;Ambulation;Regular Toilet;Grab bars;RW   Toileting- Clothing Manipulation and Hygiene: Minimal assistance;Sit to/from stand       Functional mobility during ADLs: Min guard;Minimal assistance;+2 for physical assistance;+2 for safety/equipment;Rolling walker       Vision       Perception     Praxis      Cognition Arousal/Alertness: Awake/alert Behavior During Therapy: WFL for tasks assessed/performed;Anxious(tearful at times; could quickly redirect) Overall Cognitive Status: Within Functional Limits for tasks assessed                                           Exercises     Shoulder Instructions       General Comments Pt moves very slowly and deliberately - requires increased time and encouragement for all tasks.  She had periods of grimacing and crying due to pain, but was easily redirected with conversation with no evidence of pain noted (relaxed posture, smiling).  Today, she reports that the baby's father will be providing assist as needed during the day, and possibly at night.  She also has another aunt who lives close who will be assisting.   Her 4 y.o's father and grand parents will be providing care for him until pt is able to do so (per pt report)     Pertinent Vitals/ Pain       Pain Assessment: Faces Pain Score: 5  Faces Pain Scale: Hurts even more Pain Location: bil. LEs Lt > Rt  Pain Descriptors / Indicators: Shooting;Pins and needles Pain Intervention(s): Monitored during session;Repositioned  Home Living                                          Prior Functioning/Environment              Frequency  Min 2X/week        Progress Toward Goals  OT Goals(current goals can now be found in the care plan section)  Progress towards OT goals: Progressing toward goals  Acute Rehab OT Goals Patient Stated Goal: to be able to take care of herself   Plan Discharge plan remains appropriate    Co-evaluation    PT/OT/SLP Co-Evaluation/Treatment: Yes Reason for Co-Treatment: For patient/therapist safety PT goals addressed during session: Mobility/safety with mobility;Proper use of DME OT goals addressed during session: ADL's and self-care      AM-PAC PT "6 Clicks" Daily Activity     Outcome Measure   Help from another person eating meals?: None Help from another person taking care of personal grooming?: A Little Help from another person toileting, which includes using toliet, bedpan, or urinal?: A Little Help from another person bathing  (including washing, rinsing, drying)?: A Lot Help from another person to put on and taking off regular upper body clothing?: A Little Help from another person to put on and taking off regular lower body clothing?: A Little 6 Click Score: 18    End of Session Equipment Utilized During Treatment: Rolling walker  OT Visit Diagnosis: Pain Pain - Right/Left: Left Pain - part of body: Leg   Activity Tolerance Patient limited by pain   Patient Left in bed;with call bell/phone within reach;with family/visitor present  Nurse Communication Mobility status        Time: 1150-1240 OT Time Calculation (min): 50 min  Charges: OT General Charges $OT Visit: 1 Visit OT Treatments $Self Care/Home Management : 8-22 mins  [employer], OTR/L [phone]    Jeani Hawking M 10/09/2017, 1:56 PM

## 2017-10-10 ENCOUNTER — Inpatient Hospital Stay (HOSPITAL_COMMUNITY)
Admission: RE | Admit: 2017-10-10 | Payer: Medicaid Other | Source: Ambulatory Visit | Attending: Cardiology | Admitting: Cardiology

## 2017-10-10 MED ORDER — GABAPENTIN 600 MG PO TABS
300.0000 mg | ORAL_TABLET | Freq: Three times a day (TID) | ORAL | Status: DC | PRN
  Filled 2017-10-10: qty 0.5

## 2017-10-10 MED ORDER — GABAPENTIN 300 MG PO CAPS
300.0000 mg | ORAL_CAPSULE | Freq: Three times a day (TID) | ORAL | Status: DC | PRN
  Administered 2017-10-10 – 2017-10-11 (×3): 300 mg via ORAL
  Filled 2017-10-10 (×4): qty 1

## 2017-10-10 NOTE — Care Management Note (Signed)
Case Management Note  Patient Details  Name: Sandra Rich MRN: [id number] Date of Birth: Jan 20, 1991  Subjective/Objective:               Bilateral leg pain and weakness     Action/Plan: Home with PT with [facility] 3x week 1st visit to start 10/12/17   Expected Discharge Date:     10/11/17             Expected Discharge Plan:  [place]  In-House Referral:  Clinical Social Work;OT/PT  Discharge planning Services  CM Consult  Choice offered to:  Patient  DME Arranged:  3-N-1, Tub bench, Wheelchair manual, Walker rolling DME Agency:  [place]:  PT [facility] Agency:  [place]  Status of Service:   completed    Additional Comments: CM met with patient in room at length today. Reviewed demographics and they are correct in Epic.  Patient and father of baby do not live together but patient will be staying with some family.  Patient had recommendation for (CIR)- but CM received call from Jodell Cipro - rehab admission coordinator and was notified that she lacks medical necessity for inpatient admission.  CM met with patient and patient was notified of this and patient chose [facility] PT/OT and felt comfortable with Home Health.  Choices provided and patient did not have choice just whomever would provide.  Several HH agencies called by CM:( [place], [facility], [place], [place] , [facility]) but [facility] was the agency able to provide PT but unable to provide OT at this time. No other agency in the [facility] area could provide [facility] OT.  MD made aware.  Patient and Father of the baby given the phone number of [place] [phone] and CM phone number # [phone].  Patient was instructed to have family member pick up equipment: 3 N 1, Tub bench, wheelchair, rolling walker with 5 inch wheels at the [place] office tomorrow after 10:00 10/11/17 at: [address] [place], [place] [address], patient  and dad verbalized understanding.  Patient can f/u with an OT on an outpatient f/u if the [facility] agency feels that it is needed as patient improved.  Referral for all DME and PT (physical therapy) given to Richarda Blade. With [place] # [phone] and accepted.    Yong Channel, RN 10/10/2017, 4:40 PM

## 2017-10-10 NOTE — Care Management Note (Signed)
Case Management Note  Patient Details  Name: Sandra Rich MRN: [id number] Date of Birth: [birth year]  Subjective/Objective:                 lower extremity weakness and pain    Action/Plan:  Inpatient Rehab consult (CIR)      Referral: OT/PT   Discharge planning Services  CM Consult  Status of Service:   in  process  If discussed at Long Length of Stay Meetings, dates discussed:    Additional Comments:  CM met with patient and dad in room.  PT/Marjorie Moton has recommended CIR- rehab consult made.  CM will continue to follow and assist needs. [phone]  Yong Channel, RN 10/10/2017, 11:40 AM

## 2017-10-10 NOTE — Progress Notes (Signed)
[facility] Inpatient rehab admissions - Rehab MD has reviewed the chart.  Patient lacks the medical necessity to support an inpatient rehab admission.  I am sorry that I will not be able to offer patient a rehab stay.  Recommend pursuit of other rehab options.  Call me for questions.  #[phone]

## 2017-10-10 NOTE — Progress Notes (Signed)
Ambulated Pt to BR using her wheeled walker. Pt buckled a few times but recovered on her own as this RN covered pt from behind. Washing hands appeared especially challenging but pt managed without assist. Pt. Successfully walked back to bed without incident. Left Pt in locked bed with her belongings close beside her holding her baby getting ready to feed her EBM in a bottle

## 2017-10-10 NOTE — Progress Notes (Signed)
Physical Therapy Treatment Patient Details Name: Sandra Rich MRN: [id number] DOB: 22-Dec-1990 Today's Date: 10/10/2017    History of Present Illness Patient is s/p vaginal delivery on 2/14.  Pt had epidural and per her report pushed for ~45 minutes, but with her legs in full hip and knee flexion.  MD does note she was "pushing poorly" due to heavy epidural.  Post delivery, patient with c/o pain and decreased strength in bilateral LE, L>R.    PT Comments    Patient continues to be extremely dependent for gait and mobility.  Patient has made limited progress toward goals and feel patient may benefit from CIR to maximize potential and progress mobility/gait.  Patient continues to be limited by pain and weakness in bilateral LE's, L>R.  Encouraged patient to increase weight bearing on left side while in bed (I.e. Lay on left side).      Follow Up Recommendations  CIR     Equipment Recommendations  Rolling walker with 5" wheels;Wheelchair (measurements PT)    Recommendations for Other Services Rehab consult     Precautions / Restrictions Precautions Precautions: Fall    Mobility  Bed Mobility Overal bed mobility: Needs Assistance Bed Mobility: Supine to Sit;Sit to Supine     Supine to sit: Modified independent (Device/Increase time) Sit to supine: Min assist   General bed mobility comments: required assistance to lift legs onto bed.  Able to scoot back on the bed once in bed.  Transfers Overall transfer level: Needs assistance Equipment used: Rolling walker (2 wheeled) Transfers: Sit to/from Stand Sit to Stand: Min assist         General transfer comment: patient translate extremely far forward before powering up to standing; required assistance to power up  Ambulation/Gait Ambulation/Gait assistance: Min assist Ambulation Distance (Feet): 20 Feet Assistive device: Rolling walker (2 wheeled) Gait Pattern/deviations: Step-to pattern;Decreased stance time -  left;Decreased weight shift to left;Antalgic;Decreased step length - left Gait velocity: severely decreased; max encouragement; frequent standing rest breaks   General Gait Details: Patient continues with heavy reliance on UE's during gait.  Better sequencing with advancing LLE and then RLE during gait.  Patient c/o pain throughout gait.  She did "drift" down into knee flexion on occasion during gait.  Required cueing to "stand tall" with RW.   Stairs            Wheelchair Mobility    Modified Rankin (Stroke Patients Only)       Balance Overall balance assessment: Mild deficits observed, not formally tested(deficits due to pain and decreased motor control of LE's)   Sitting balance-Leahy Scale: Good                                      Cognition Arousal/Alertness: Awake/alert Behavior During Therapy: WFL for tasks assessed/performed Overall Cognitive Status: Within Functional Limits for tasks assessed                                        Exercises      General Comments        Pertinent Vitals/Pain Pain Assessment: 0-10 Pain Score: 7  Pain Location: bil. LEs Lt > Rt  Pain Descriptors / Indicators: Shooting;Pins and needles Pain Intervention(s): Monitored during session;Limited activity within patient's tolerance;Relaxation    Home Living  Prior Function            PT Goals (current goals can now be found in the care plan section) Acute Rehab PT Goals PT Goal Formulation: With patient Time For Goal Achievement: 10/17/17 Potential to Achieve Goals: Good Progress towards PT goals: Progressing toward goals(very slow progress; goals remain appropriate)    Frequency    Min 3X/week      PT Plan Discharge plan needs to be updated(recommend CIR due to slow progress)    Co-evaluation              AM-PAC PT "6 Clicks" Daily Activity  Outcome Measure  Difficulty turning over in bed  (including adjusting bedclothes, sheets and blankets)?: None Difficulty moving from lying on back to sitting on the side of the bed? : A Little Difficulty sitting down on and standing up from a chair with arms (e.g., wheelchair, bedside commode, etc,.)?: A Little Help needed moving to and from a bed to chair (including a wheelchair)?: A Little Help needed walking in hospital room?: A Little Help needed climbing 3-5 steps with a railing? : Total 6 Click Score: 17    End of Session   Activity Tolerance: Patient limited by pain;Patient limited by fatigue Patient left: in bed;with call bell/phone within reach Nurse Communication: Mobility status PT Visit Diagnosis: Unsteadiness on feet (R26.81);Other abnormalities of gait and mobility (R26.89);Muscle weakness (generalized) (M62.81)     Time: 0981-19140850-0920 PT Time Calculation (min) (ACUTE ONLY): 30 min  Charges:  $Gait Training: 23-37 mins                    G Codes:       10/10/2017 [facility] Center[facility]ie Tkeyah Burkman, PT [phone]     Olivia CanterMoton, Sandra Hebert M 10/10/2017, 10:40 AM

## 2017-10-10 NOTE — Care Management (Deleted)
dme

## 2017-10-10 NOTE — Care Management (Signed)
    Durable Medical Equipment  (From admission, onward)        Start     Ordered   10/10/17 1520  For home use only DME standard manual wheelchair with seat cushion  Once    Comments:  Patient suffers from lower extremity weakness which impairs their ability to perform daily activities like bathing, grooming, and toileting in the home.  A walker will not resolve  issue with performing activities of daily living. A wheelchair will allow patient to safely perform daily activities. Patient can safely propel the wheelchair in the home or has a caregiver who can provide assistance.  Accessories: elevating leg rests (ELRs), wheel locks, extensions and anti-tippers.   10/10/17 1520   10/10/17 1518  For home use only DME 3 n 1  Once     10/10/17 1520   10/10/17 1518  For home use only DME Tub bench  Once     10/10/17 1520   10/10/17 1518  For home use only DME Walker rolling  Once    Comments:  5 inch wheels  Question:  Patient needs a walker to treat with the following condition  Answer:  Lower extremity weakness   10/10/17 1520

## 2017-10-10 NOTE — Progress Notes (Signed)
Occupational Therapy Treatment Patient Details Name: Fay RecordsHannah Petz MRN: [id number] DOB: 03-16-1991 Today's Date: 10/10/2017    History of present illness Patient is s/p vaginal delivery on 2/14.  Pt had epidural and per her report pushed for ~45 minutes, but with her legs in full hip and knee flexion.  MD does note she was "pushing poorly" due to heavy epidural.  Post delivery, patient with c/o pain and decreased strength in bilateral LE, L>R.   OT comments  Reviewed safe techniques for ADLs with pt.  She reports she will have 24/7 assist with childcare.  Instructed her to have someone with her at all times when attempting to ambulate.  She requires min guard assist - min A for ADLs.  She required mod A x1 to prevent fall while ambulating.   She reports she is eager to discharge home, and feels confident in her ability to function at home with assist from family.    Follow Up Recommendations  Home health OT;Supervision/Assistance - 24 hour    Equipment Recommendations  3 in 1 bedside commode;Tub/shower bench;Wheelchair (measurements OT)    Recommendations for Other Services      Precautions / Restrictions Precautions Precautions: Fall       Mobility Bed Mobility Overal bed mobility: Modified Independent             General bed mobility comments: increased time   Transfers Overall transfer level: Needs assistance Equipment used: Rolling walker (2 wheeled) Transfers: Sit to/from Office managertand;Stand Pivot Transfers;Squat Pivot Transfers Sit to Stand: Min guard Stand pivot transfers: Min guard       General transfer comment: Pt able to perform with min guard with increased effort     Balance Overall balance assessment: Needs assistance Sitting-balance support: Feet supported Sitting balance-Leahy Scale: Good     Standing balance support: During functional activity;Bilateral upper extremity supported Standing balance-Leahy Scale: Poor                              ADL either performed or assessed with clinical judgement   ADL       Grooming: Wash/dry hands;Wash/dry face;Oral care;Brushing hair;Minimal assistance;Standing                   Toilet Transfer: Min guard;Ambulation;Stand-pivot;Minimal assistance   Toileting- Clothing Manipulation and Hygiene: Minimal assistance;Sit to/from stand       Functional mobility during ADLs: Minimal assistance;Min guard;Rolling walker General ADL Comments: Pt most concerned with being able to stand at sink to perform grooming.  Practiced standing at sink with equal distribution of weight on bil LEs and how to support self with one UE while using the other UEs.  Also discussed use of w/c or other seat to sit at sink and perform ADLs if she doesn't feel she can stand safely.  She reports she showered with nsg last pm using a seat and she did not have any difficulty negotiation the step into the shower.  She doesn't feel she needs to practice this today, and voices she knows how to use shower seat.  Discussed how to maneuver RW in tight spaces in her home, and how to propel the w/c through her house and to only hold baby while she is seated.  She states she has plenty of assistance at discharge and friends and family will be assisting her 24/7 with childcare      Vision       Perception  Praxis      Cognition Arousal/Alertness: Awake/alert Behavior During Therapy: Anxious Overall Cognitive Status: Within Functional Limits for tasks assessed                                          Exercises     Shoulder Instructions       General Comments while pt ambulating, she occasionally "buckled knees', but with encouragement, she would correct with min guard assist.  However, pt required mod a to prevent fall as she slowly lowered towards the floor with hands on walker, and required mod A from therapist to prevent fall     Pertinent Vitals/ Pain       Pain Assessment:  Faces Faces Pain Scale: Hurts even more Pain Location: bil. LEs Lt > Rt  Pain Descriptors / Indicators: Shooting;Pins and needles Pain Intervention(s): Monitored during session  Home Living                                          Prior Functioning/Environment              Frequency  Min 2X/week        Progress Toward Goals  OT Goals(current goals can now be found in the care plan section)  Progress towards OT goals: Progressing toward goals     Plan Discharge plan remains appropriate    Co-evaluation                 AM-PAC PT "6 Clicks" Daily Activity     Outcome Measure   Help from another person eating meals?: None Help from another person taking care of personal grooming?: A Little Help from another person toileting, which includes using toliet, bedpan, or urinal?: A Little Help from another person bathing (including washing, rinsing, drying)?: A Lot Help from another person to put on and taking off regular upper body clothing?: A Little Help from another person to put on and taking off regular lower body clothing?: A Little 6 Click Score: 18    End of Session Equipment Utilized During Treatment: Rolling walker  OT Visit Diagnosis: Pain Pain - Right/Left: Left Pain - part of body: Leg   Activity Tolerance Patient limited by pain   Patient Left in bed;with call bell/phone within reach;with family/visitor present   Nurse Communication Mobility status        Time: 2956-2130 OT Time Calculation (min): 56 min  Charges: OT General Charges $OT Visit: 1 Visit OT Treatments $Self Care/Home Management : 38-52 mins $Therapeutic Activity: 8-22 mins  [employer], OTR/L [phone]    Jeani Hawking M 10/10/2017, 6:56 PM

## 2017-10-10 NOTE — Lactation Note (Addendum)
This note was copied from a baby's chart. Lactation Consultation Note  Patient Name: Sandra Rich QMVHQ'IToday's Date: 10/10/2017   Visited with P2 Mom of term baby at 474 days old.  Mom exclusively pumping and bottle feeding.  Milk volume in, breasts heavier, tender, and soft to palpation.  Some knots under left breast.   Mom instructed to use ice for 20 mins between pumping and breast massage with warm moist heat prior to pumping.   Mom pumping for 30 mins and obtaining 60 ml each pumping.  Talked to Mom about a pump after discharge.  Mom is wanting a [employer] conservation officerWIC loaner.  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 [facility].  Encouraged Mom to pump both breasts for 20 mins, adding breast massage and hand expression also. Mom to pump 8-12 times per 24 hrs.    If Mom interested in latching baby to the breast, she is to call for RN to assist with this.  Mom states baby would latch last time she tried.  Encouraged STS.  Mom aware of OP lactation services available to her.  Encouraged to make appt for f/u after discharge.   [name], [name] E 10/10/2017, 11:20 AM

## 2017-10-10 NOTE — Progress Notes (Signed)
   10/10/17 1539  [place] Rounding  Assessment Alert  Intervention Call light w/in reach;Comfort measures;Environment secured  Pain Assessment  Pain Assessment 0-10  Pain Score 6  Pain Location Leg  Pain Orientation Left;Right  Pain Descriptors / Indicators Cramping  Pain Frequency Constant  Pain Onset On-going  pt states that the Gabapentin at dose of 300 makes her loopy and mostly controls pain. Possibly too much- makes her sleepy.

## 2017-10-10 NOTE — Progress Notes (Addendum)
Patient is eating, still ambulating, voiding on own.  Pain control is fair- complains of continued shooting pain w ambulation and with movement  Spoke w PT today--feels that patient may benefit from inpatient rehab as she would have anticipated more progress with ambulation than patient has displayed to date  Vitals:   10/08/17 2142 10/09/17 0500 10/09/17 1841 10/10/17 0624  BP: 104/74 119/77 116/65 131/74  Pulse: 98 85 83 63  Resp: 16 16 16 16   Temp: 97.8 F (36.6 C) 97.9 F (36.6 C) 97.9 F (36.6 C) 98.4 F (36.9 C)  TempSrc: Oral Oral Oral Oral  SpO2:   99%   Weight:      Height:        Fundus firm Ext: no edema  Lab Results  Component Value Date   WBC 14.8 (H) 10/07/2017   HGB 10.5 (L) 10/07/2017   HCT 32.6 (L) 10/07/2017   MCV 80.7 10/07/2017   PLT 237 10/07/2017    --/--/B POS (02/14 0100)/RI  A/P G2P2 ppd#2 with lower extremity weakness and pain,  L>R --PT to contact inpatient rehab for assessment today --Cont PT/OT --will try gabapentin for pain    Addendum: pt does not meet criteria for inpatient rehab.  [facility]     Durable Medical Equipment  (From admission, onward)        Start     Ordered   10/10/17 1520  For home use only DME standard manual wheelchair with seat cushion  Once    Comments:  Patient suffers from lower extremity weakness which impairs their ability to perform daily activities like bathing, grooming, and toileting in the home.  A walker will not resolve  issue with performing activities of daily living. A wheelchair will allow patient to safely perform daily activities. Patient can safely propel the wheelchair in the home or has a caregiver who can provide assistance.  Accessories: elevating leg rests (ELRs), wheel locks, extensions and anti-tippers.   10/10/17 1520   10/10/17 1518  For home use only DME 3 n 1  Once     10/10/17 1520   10/10/17 1518  For home use only DME Tub bench  Once     10/10/17 1520   10/10/17 1518   For home use only DME Walker rolling  Once    Comments:  5 inch wheels  Question:  Patient needs a walker to treat with the following condition  Answer:  Lower extremity weakness   10/10/17 1520           Carroll Ranney GEFFEL [employer]

## 2017-10-10 NOTE — Progress Notes (Signed)
Per Lelon FrohlichEugenia Logue, MD at [facility] deemed not appropriate for acute inpatient rehab admission.

## 2017-10-10 NOTE — Progress Notes (Signed)
Rehab Admissions Coordinator Note:  Patient was screened by Trish MageLogue, Jacque Garrels M for appropriateness for an Inpatient Acute Rehab Consult. Noted PT now recommending inpatient rehab admission.  I will discuss findings with rehab MD here at [facility] and will then notify all if patient deemed appropriate for acute inpatient rehab admission.  Call me for questions.   Lelon FrohlichLogue, Aldine Grainger M 10/10/2017, 11:11 AM  I can be reached at [phone].

## 2017-10-11 MED ORDER — IBUPROFEN 600 MG PO TABS: 600.0000 mg | ORAL_TABLET | Freq: Four times a day (QID) | ORAL | 0 refills | Status: DC

## 2017-10-11 MED ORDER — GABAPENTIN 300 MG PO CAPS: 300.0000 mg | ORAL_CAPSULE | Freq: Three times a day (TID) | ORAL | 0 refills | Status: DC | PRN

## 2017-10-11 NOTE — Discharge Summary (Signed)
Obstetric Discharge Summary Reason for Admission: onset of labor Prenatal Procedures: ultrasound Intrapartum Procedures: spontaneous vaginal delivery Postpartum Procedures: none Complications-Operative and Postpartum: bilateral leg weakness, foot drop Hemoglobin  Date Value Ref Range Status  10/07/2017 10.5 (L) 12.0 - 15.0 g/dL Final   HCT  Date Value Ref Range Status  10/07/2017 32.6 (L) 36.0 - 46.0 % Final    Physical Exam:  General: alert and cooperative Lochia: appropriate Uterine Fundus: firm DVT Evaluation: No evidence of DVT seen on physical exam.  Discharge Diagnoses: Term Pregnancy-delivered, bilateral neurologic symptoms affecting gait, home PT set up for rehab, s/p neuro and psych evaluation while in hospital  Discharge Information: Date: 10/11/2017 Activity: pelvic rest and per PT recommendations Diet: routine Medications: PNV, Ibuprofen and gabapentin Condition: improved Instructions: refer to practice specific booklet Discharge to: home Follow-up Information    Sandra AlandAnderson, [name], MD Follow up in 4 week(s).   Specialty:  Obstetrics and Gynecology Contact information: [address] [place] [id number] [phone]           Newborn Data: Live born female  Birth Weight: 6 lb 7.7 oz (2940 g) APGAR: 8, 9  Newborn Delivery   Birth date/time:  10/06/2017 06:39:00 Delivery type:  Vaginal, Vacuum (Extractor)     Home with mother.  Sandra [name], Sandra [name] 10/11/2017, 10:11 AM

## 2017-10-11 NOTE — Progress Notes (Signed)
Assisted pt to bathroom. Pt ambulated with wheeled walker to bathroom, right knee occasionally "buckling" but self recovered by pt. Pt able to do peri care and get into shower.

## 2017-10-11 NOTE — Lactation Note (Signed)
This note was copied from a baby's chart. Lactation Consultation Note  Patient Name: Sandra [name]: 10/11/2017 Reason for consult: Follow-up assessment  Visited with Mom on day of discharge, baby is 405 days old and gaining weight well.   Baby is taking 60 ml at each feeding of EBM by bottle.  Mom pumping at times every hr.  Encouraged Mom to pump both breasts when baby is fed her breast milk.  If breasts are full and uncomfortable, pump for comfort, not to empty breasts.   [employer] loaner pump given with instructions.  Encouraged Mom to come see OP lactation for follow-up and assistance with re-latching baby to the breast.  Message sent to OP Clinic.  Mom going home with PT and equipment to aid her at home.    Mom knows to call prn for any concerns.'  Consult Status Consult Status: Complete [name]: 10/11/17    [name], [name] 10/11/2017, 11:21 AM

## 2017-10-27 ENCOUNTER — Ambulatory Visit: Payer: Medicaid Other | Admitting: Neurology

## 2017-10-27 ENCOUNTER — Encounter: Payer: Self-pay | Admitting: Neurology

## 2017-10-27 ENCOUNTER — Other Ambulatory Visit: Payer: Self-pay

## 2017-10-27 DIAGNOSIS — F444 Conversion disorder with motor symptom or deficit: Secondary | ICD-10-CM | POA: Diagnosis not present

## 2017-10-27 HISTORY — DX: Conversion disorder with motor symptom or deficit: F44.4

## 2017-10-27 NOTE — Progress Notes (Signed)
Reason for visit: Gait disorder  [facility]  Sandra RecordsHannah Rich is a 27 y.o. female  History of present illness:  Sandra Rich is a 27 year old left-handed white female with a history of delivery that occurred on 06 October 2017.  The patient had an epidural for the delivery, she had a vaginal delivery.  The patient indicated that it took 14 hours for the numbness and paresthesias to wear off from the epidural, when the epidural wore off, she found that she was not able to walk.  The patient reports problems with numbness from the left foot up the leg to the hip area, she also has pain in the same distribution.  She reports weakness of the entire left leg.  She is able to bear some weight with the right leg.  She denies issues controlling the bowels or the bladder.  She has gotten to the point where she can walk with a walker, but she uses a wheelchair to go longer distances.  She reports some left arm pain as well, she denies any numbness on the face.  She denies any neck pain.  She has had MRI evaluation of the cervical, thoracic, and lumbar spines that were unremarkable.  She was seen by a neurologist in the hospital, Dr. Otelia LimesLindzen, she was felt to have a conversion reaction.  The patient comes to the office today for an evaluation.  Past Medical History:  Diagnosis Date  . Anxiety   . Asthma    childhood  . Eczema   . Medical history non-contributory   . 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  . Thyroid disease    hypo    Past Surgical History:  Procedure Laterality Date  . FRACTURE SURGERY    . RHINOPLASTY    . TONSILLECTOMY    . wisdom teeth removal      Family History  Problem Relation Age of Onset  . Depression Brother     Social history:  reports that  has never smoked. she has never used smokeless tobacco. She reports that she does not drink alcohol or use drugs.  Medications:  Prior to Admission medications   Medication Sig Start Date End Date  Taking? Authorizing Provider  ibuprofen (ADVIL,MOTRIN) 600 MG tablet Take 1 tablet (600 mg total) by mouth every 6 (six) hours. 10/11/17  Yes Philip Aspenallahan, Sidney, DO  Prenatal Vit-Fe Fumarate-FA (PRENATAL MULTIVITAMIN) TABS tablet Take 1 tablet by mouth daily at 12 noon.   Yes [provider]  gabapentin (NEURONTIN) 300 MG capsule Take 1 capsule (300 mg total) by mouth 3 (three) times daily as needed (for neuropathic leg pain). Patient not taking: Reported on 10/27/2017 10/11/17   Philip Aspenallahan, Sidney, DO      Allergies  Allergen Reactions  . Morphine Swelling and Other (See Comments)    Reaction:  All over body swelling  . Stadol [Butorphanol] Other (See Comments)    Dizziness and blurred vision  . Food Color Red [Red Dye] Itching, Swelling and Other (See Comments)    Reaction:  Lip swelling    ROS:  Out of a complete 14 system review of symptoms, the patient complains only of the following symptoms, and all other reviewed systems are negative.  Numbness, weakness Aching muscles  Blood pressure 120/80, height 5' (1.524 m), weight 117 lb (53.1 kg), unknown if currently breastfeeding.  Physical Exam  General: The patient is alert and cooperative at the time of the examination.  Eyes: Pupils are  equal, round, and reactive to light. Discs are flat bilaterally.  Neck: The neck is supple, no carotid bruits are noted.  Respiratory: The respiratory examination is clear.  Cardiovascular: The cardiovascular examination reveals a regular rate and rhythm, no obvious murmurs or rubs are noted.  Skin: Extremities are without significant edema.  Neurologic Exam  Mental status: The patient is alert and oriented x 3 at the time of the examination. The patient has apparent normal recent and remote memory, with an apparently normal attention span and concentration ability.  Cranial nerves: Facial symmetry is present. There is good sensation of the face to pinprick and soft touch bilaterally.  The strength of the facial muscles and the muscles to head turning and shoulder shrug are normal bilaterally. Speech is well enunciated, no aphasia or dysarthria is noted. Extraocular movements are full. Visual fields are full. The tongue is midline, and the patient has symmetric elevation of the soft palate. No obvious hearing deficits are noted.  Motor: The motor testing reveals 5 over 5 strength of all 4 extremities.  The patient does have some giveaway type weakness with dorsiflexion of the left foot, no true weakness is seen.  Good symmetric motor tone is noted throughout.  Sensory: Sensory testing is intact to pinprick, soft touch, vibration sensation, and position sense on all 4 extremities, with exception some decrease in pinprick sensation, vibration sensation of the left leg below the knee. No evidence of extinction is noted.  Coordination: Cerebellar testing reveals good finger-nose-finger and heel-to-shin bilaterally.  Gait and station: Gait is associated with instability, the patient drags the left leg, walks with her knees flexed.  The patient has a slow gait, unsteady.  Reflexes: Deep tendon reflexes are symmetric and normal bilaterally. Toes are downgoing bilaterally.   Assessment/Plan:  1.  Conversion reaction, psychogenic gait disorder  I would agree with the neurologic consultation with Dr. Otelia Limes.  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  I would not recommend any further workup.  I have instructed the family members to cut off any attention drawn to this gait disorder.  Over time, the patient will return to her normal ambulatory pattern.  Marlan Palau MD 10/27/2017 4:52 PM  [facility] [address] [place], [place] [address]  Phone [phone] Fax [phone]

## 2018-10-25 IMAGING — MR MR CERVICAL SPINE W/O CM
4 of 5 series · 19 of 48 positions shown · non-contrast
Comparison: None.

CLINICAL DATA: Bilateral leg numbness and tingling, worse on the
left. Left foot drop. Childbirth on 10/06/2017.

EXAM:
MRI CERVICAL SPINE WITHOUT CONTRAST
TECHNIQUE: Multiplanar, multisequence MR imaging of the cervical spine was
performed. No intravenous contrast was administered.

[Series 2: T2 · sagittal · 3.0mm · 0.43mm/px · 6 of 16 slices shown (1 of 2)]
[im 1/16]
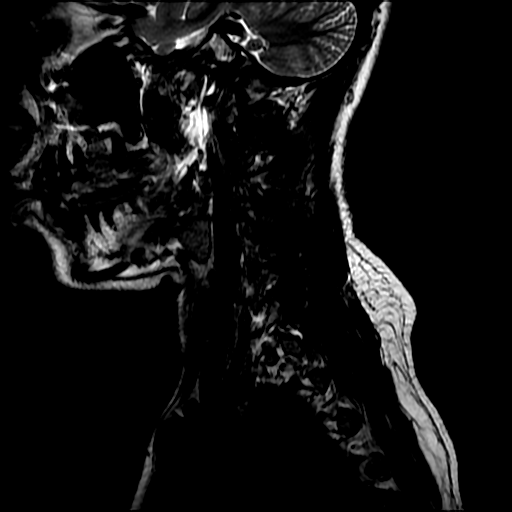
[im 4/16]
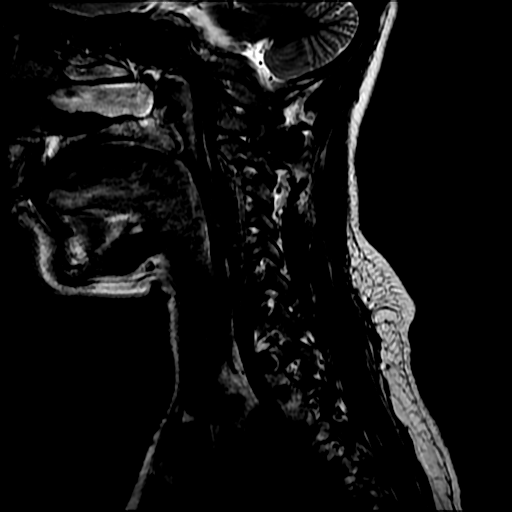
[im 7/16]
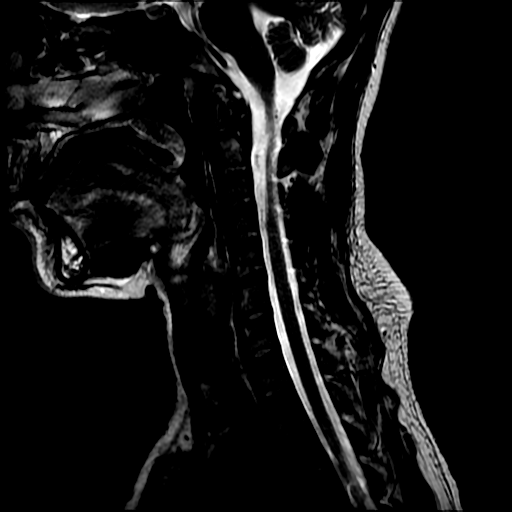
[im 10/16]
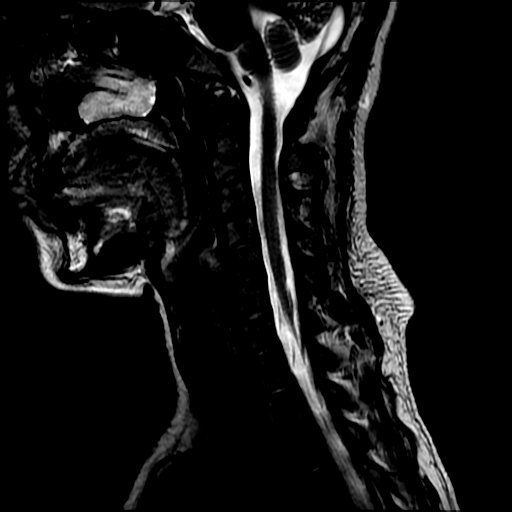
[im 13/16]
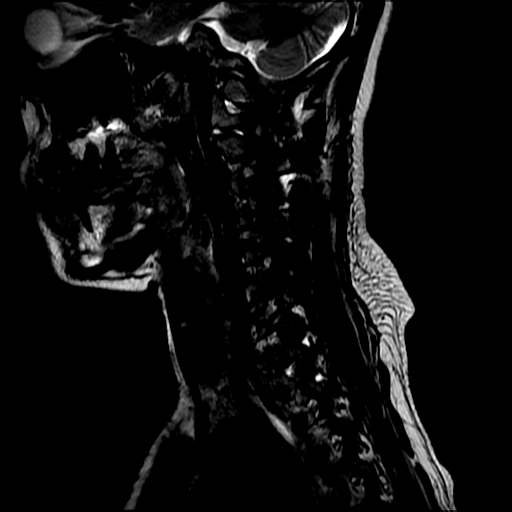
[im 16/16]
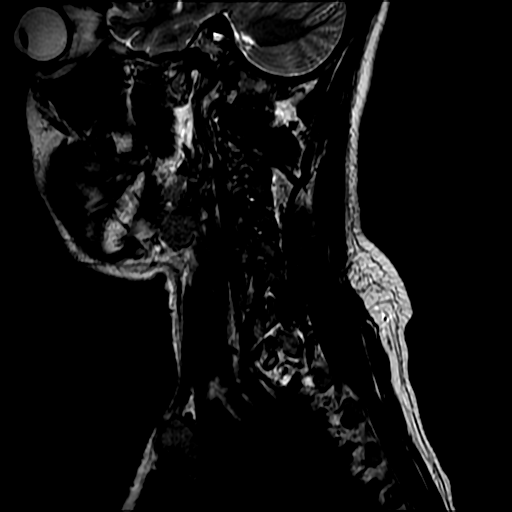

[Series 3: FLAIR · sagittal · 3.0mm · 0.43mm/px · 3 of 16 slices shown]
[im 3/16]
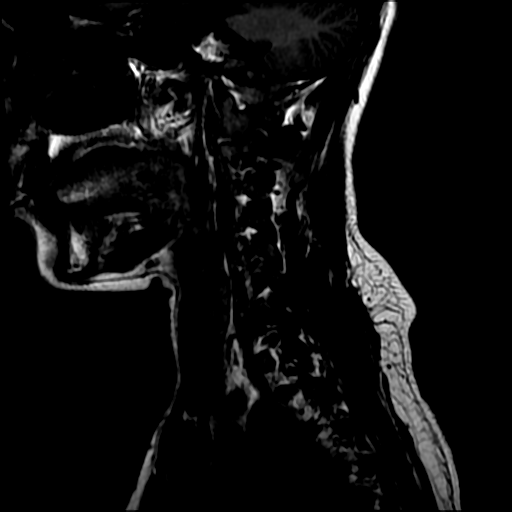
[im 8/16]
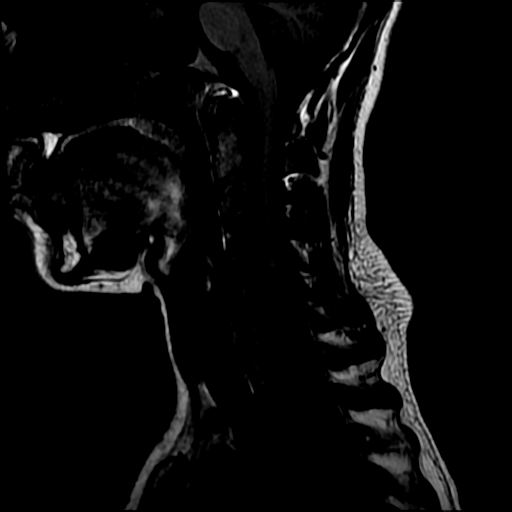
[im 13/16]
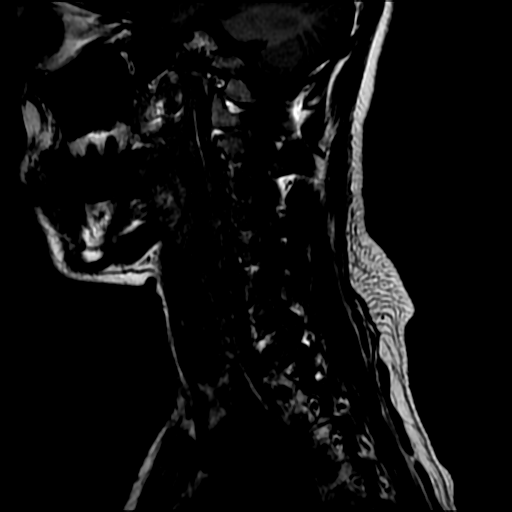

[Series 4: STIR · sagittal · 3.0mm · 0.43mm/px · 3 of 16 slices shown]
[im 3/16]
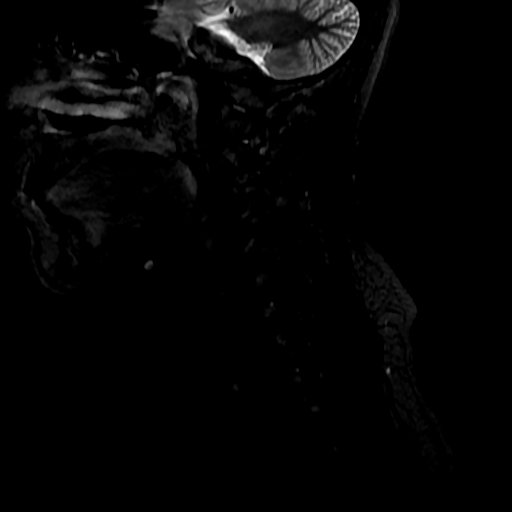
[im 8/16]
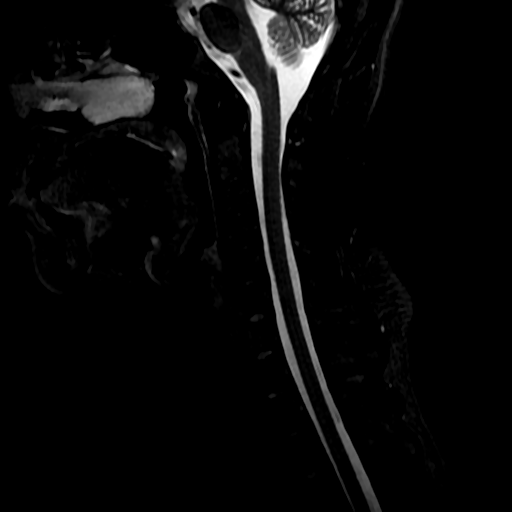
[im 13/16]
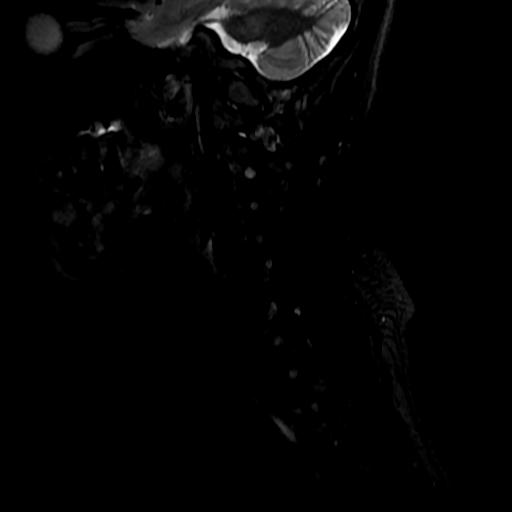

[Series 6: T2 · axial · 3.0mm · 0.35mm/px · z∈[-56,+26]mm · 7 of 30 slices shown (2 of 2)]
[im 1/30]
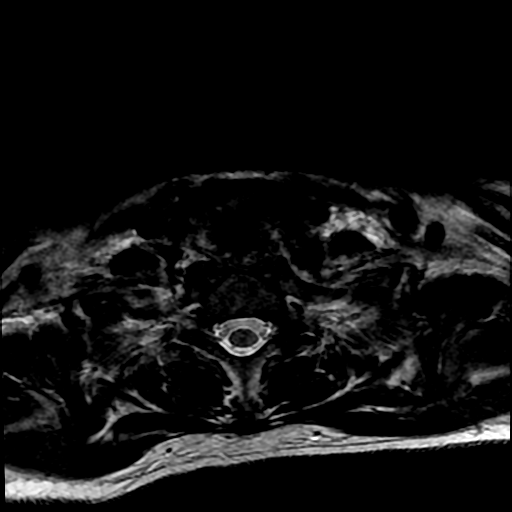
[im 5/30]
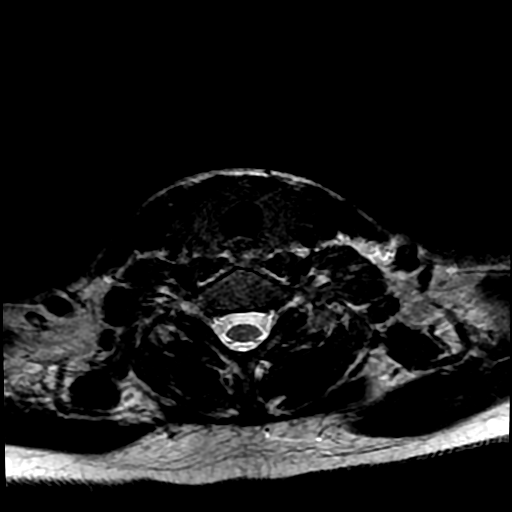
[im 10/30]
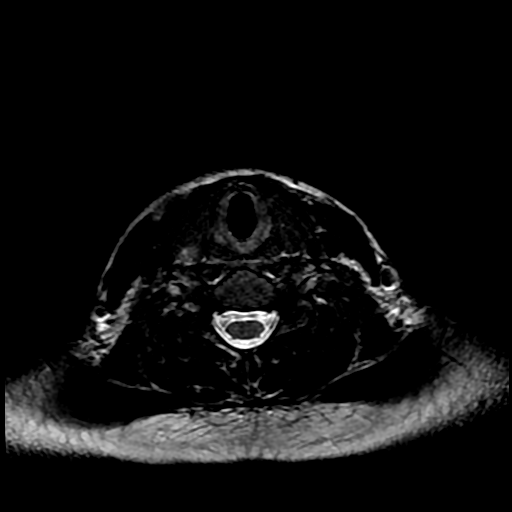
[im 13/30]
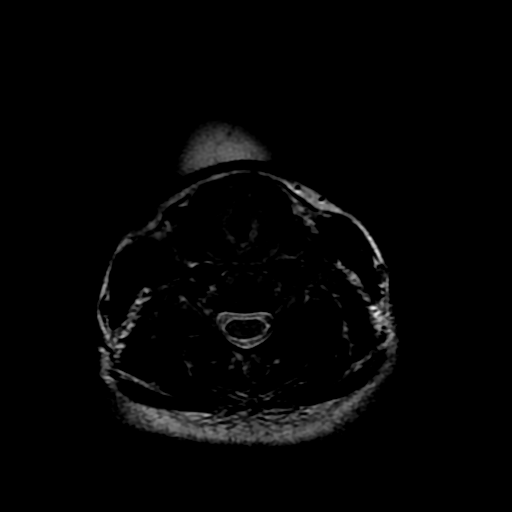
[im 15/30]
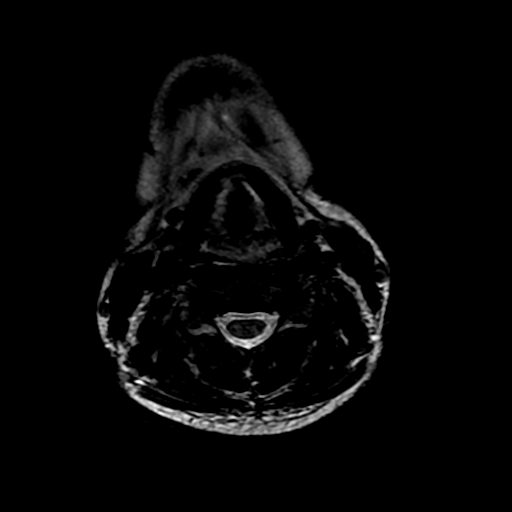
[im 17/30]
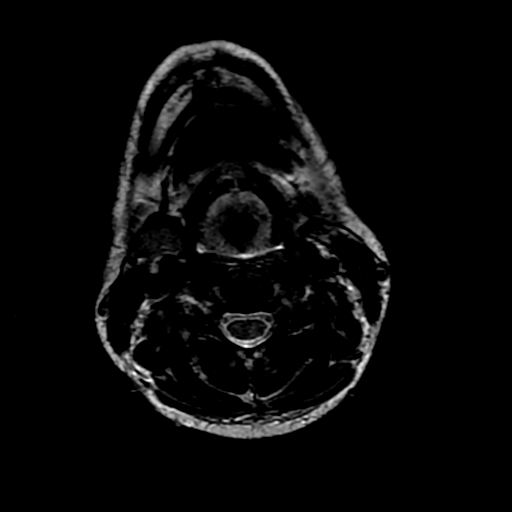
[im 25/30]
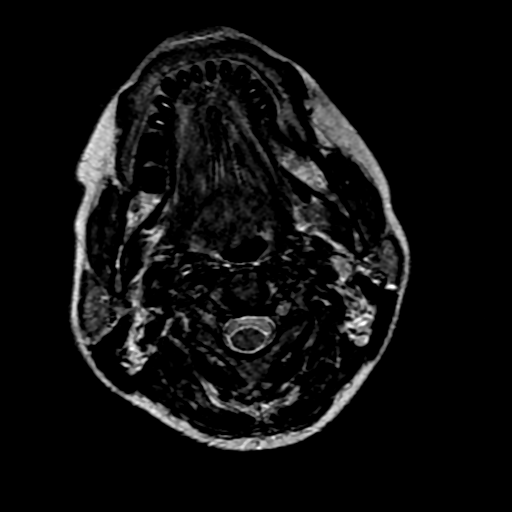

[19 of 48 positions shown; findings below may reference images not displayed]

FINDINGS: Axial images are mildly motion degraded.

Alignment: Straightening/slight reversal of the normal cervical
lordosis. No listhesis.

Vertebrae: Diminished bone marrow signal intensity diffusely which
may relate to patient's known anemia. No focal osseous lesion,
fracture, or significant marrow edema.

Cord: Normal signal and morphology.

Posterior Fossa, vertebral arteries, paraspinal tissues:
Unremarkable.

Disc levels:

Disc space heights are preserved throughout the cervical spine
without a disc herniation identified. The spinal canal and neural
foramina are widely patent.
IMPRESSION: 1. Normal cervical and upper thoracic spinal cord.
2. No disc herniation or evidence of neural impingement.

## 2018-10-25 IMAGING — MR MR THORACIC SPINE W/O CM
4 of 6 series · 19 of 48 positions shown · non-contrast
Comparison: None.

CLINICAL DATA: Bilateral leg numbness and tingling, worse on the
left. Left foot drop. Childbirth on 10/06/2017

EXAM:
MRI THORACIC SPINE WITHOUT CONTRAST
TECHNIQUE: Multiplanar, multisequence MR imaging of the thoracic spine was
performed. No intravenous contrast was administered.

[Series 2: T1 · sagittal · 3.0mm · 0.90mm/px · 3 of 12 slices shown (1 of 2)]
[im 1/12]
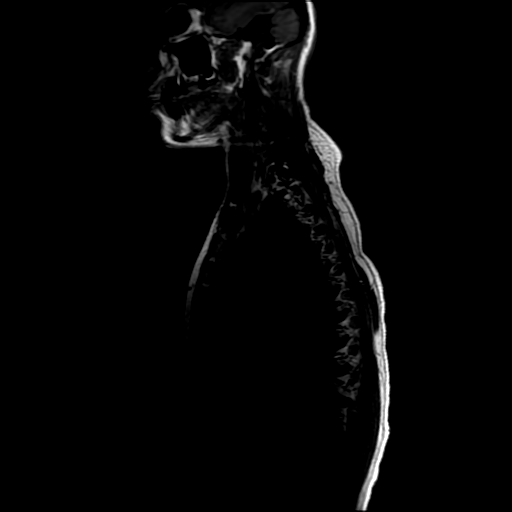
[im 6/12]
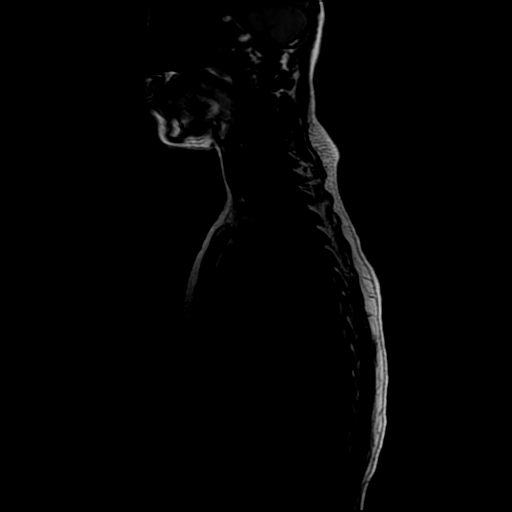
[im 12/12]
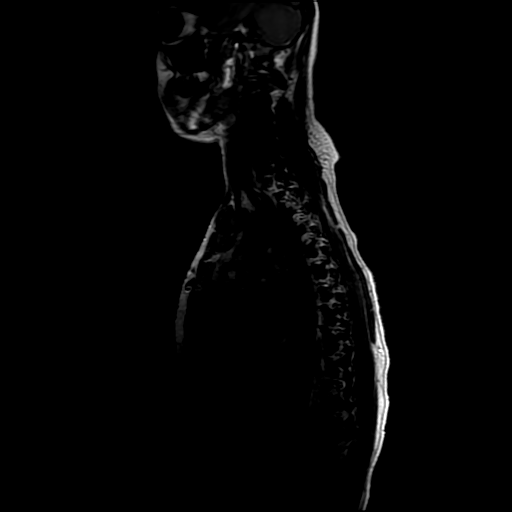

[Series 3: T2 · sagittal · 3.0mm · 0.62mm/px · 5 of 13 slices shown (1 of 2)]
[im 1/13]
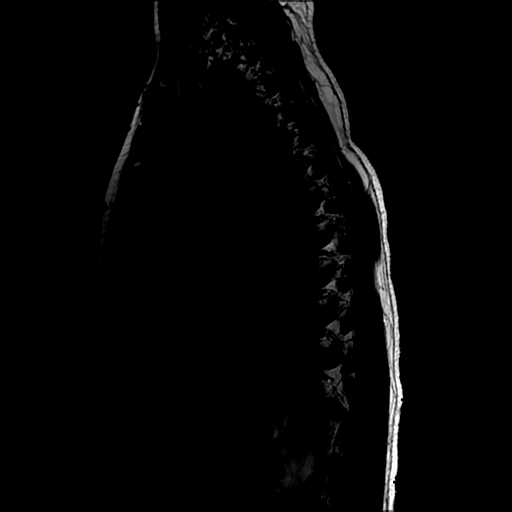
[im 4/13]
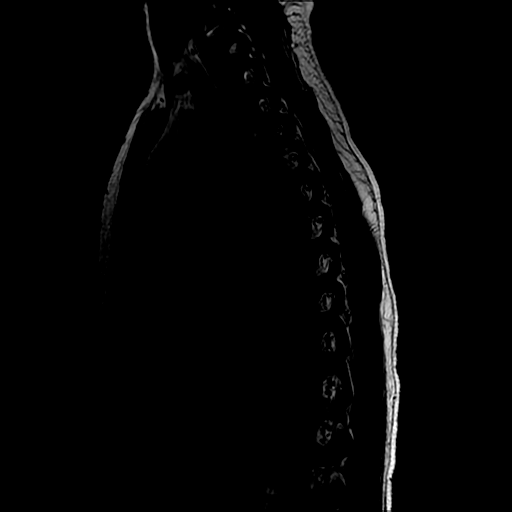
[im 7/13]
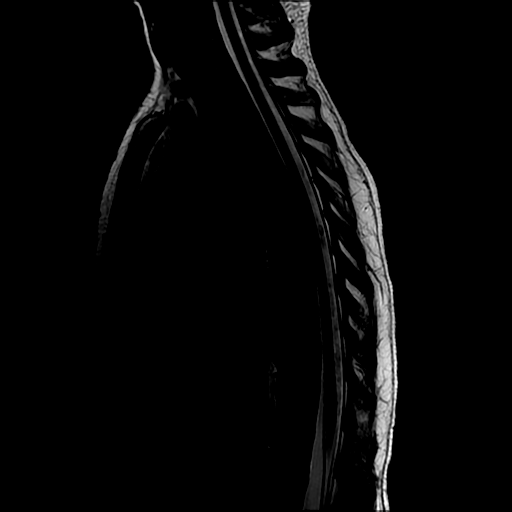
[im 10/13]
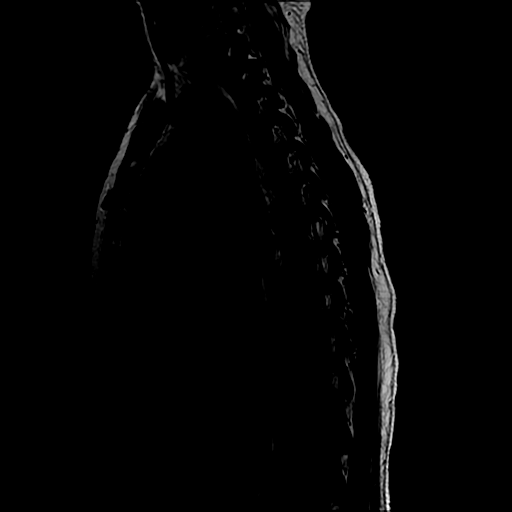
[im 13/13]
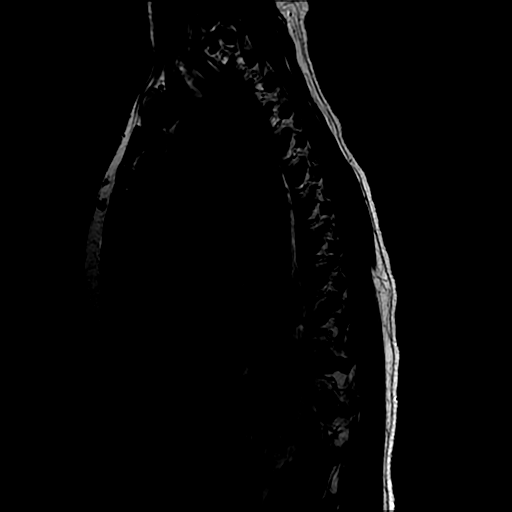

[Series 5: T1 · sagittal · 3.0mm · 0.62mm/px · 3 of 13 slices shown (2 of 2)]
[im 1/13]
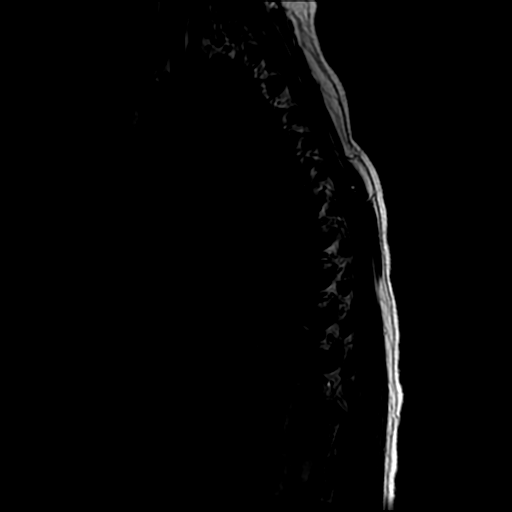
[im 9/13]
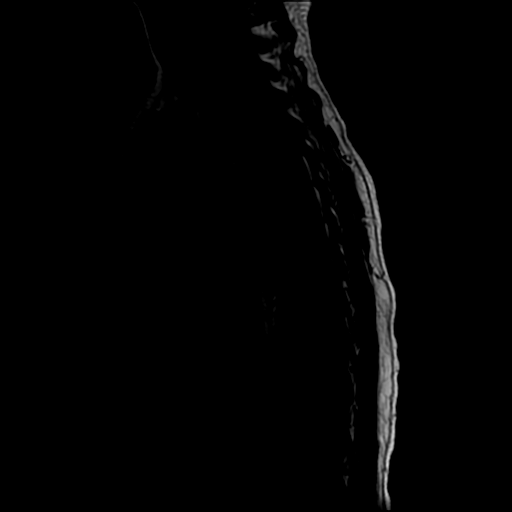
[im 13/13]
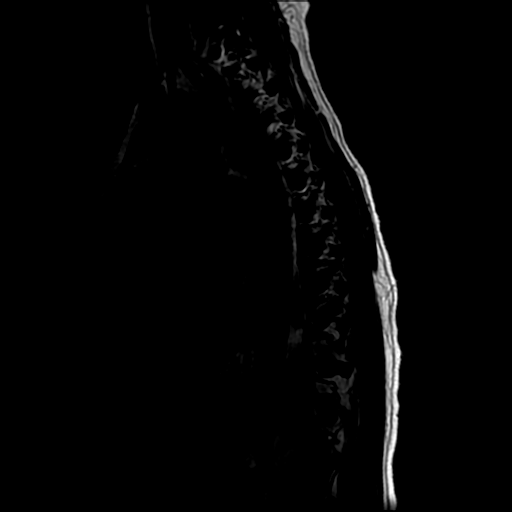

[Series 6: T2 · axial · 4.0mm · 0.39mm/px · z∈[-282,-73]mm · 8 of 46 slices shown (2 of 2)]
[im 1/46]
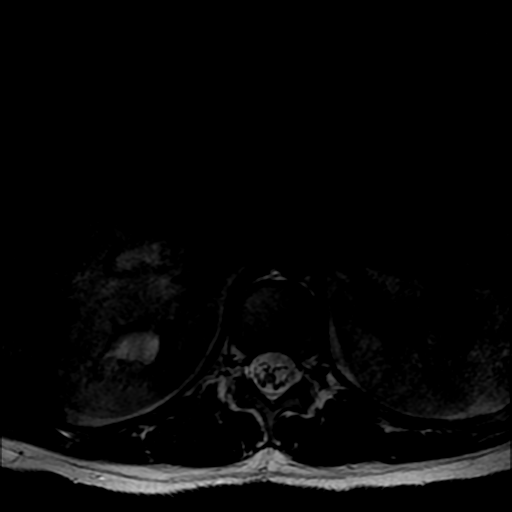
[im 7/46]
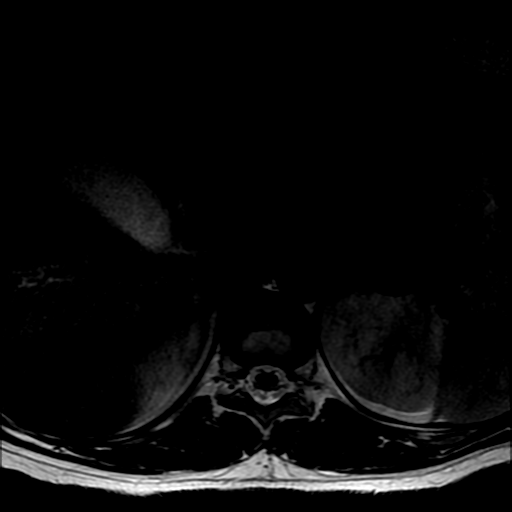
[im 13/46]
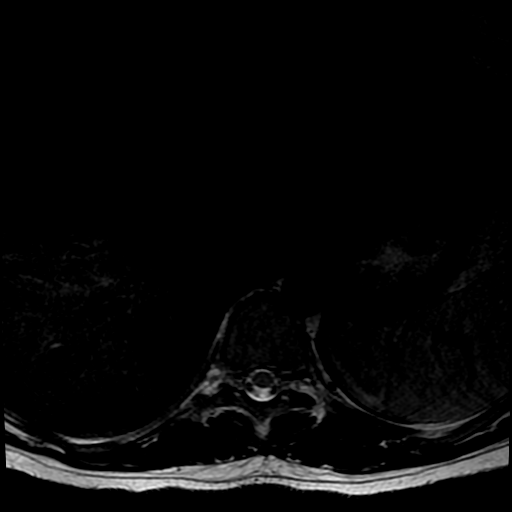
[im 20/46]
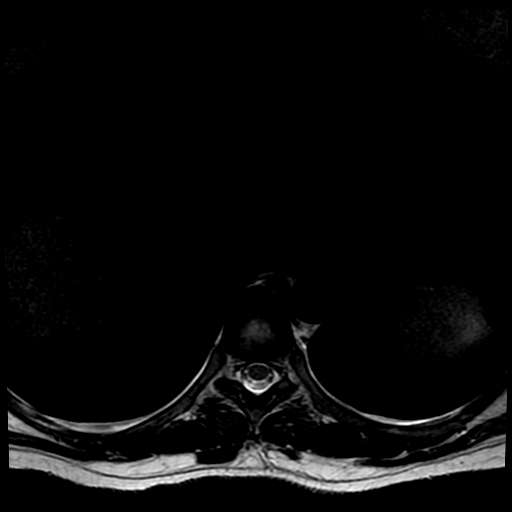
[im 23/46]
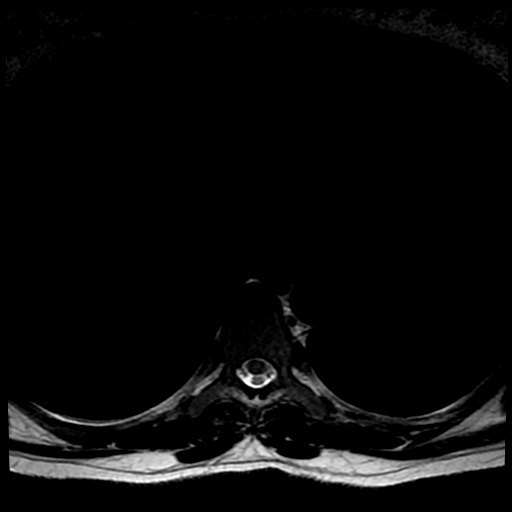
[im 26/46]
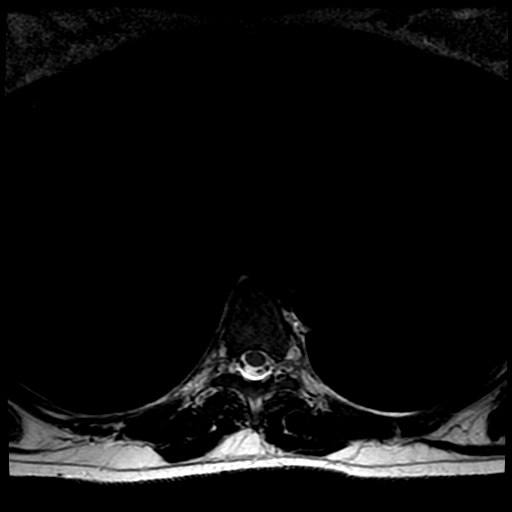
[im 33/46]
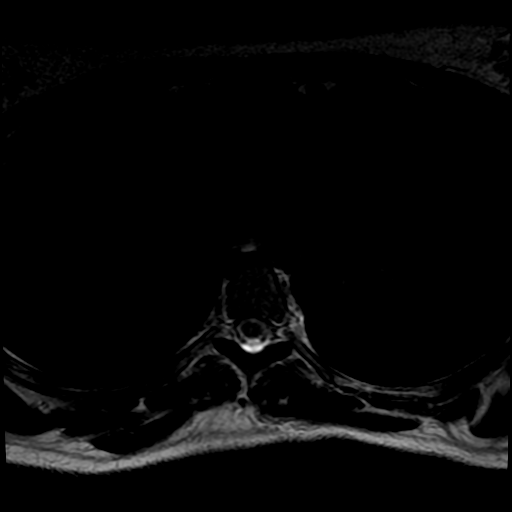
[im 39/46]
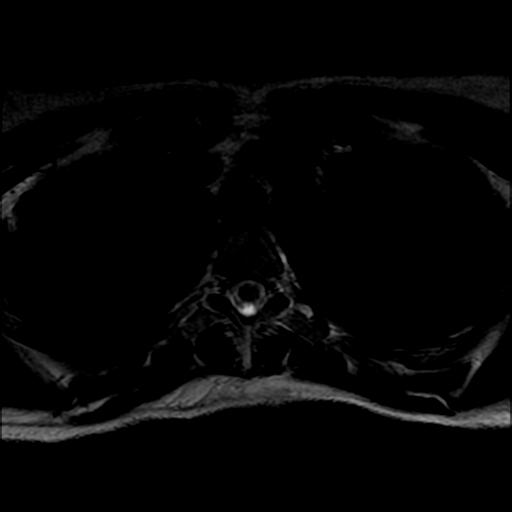

[19 of 48 positions shown; findings below may reference images not displayed]

FINDINGS: Alignment:  Normal.

Vertebrae: Diminished bone marrow signal intensity diffusely which
may relate to patient's known anemia. No focal osseous lesion,
fracture, or significant marrow edema.

Cord:  Normal signal and morphology.

Paraspinal and other soft tissues: Unremarkable.

Disc levels:

At most minimal lower thoracic spondylosis. Minimal degenerative
fatty marrow changes along the T10 superior endplate. Preserved disc
height and hydration throughout the thoracic spine without disc
herniation identified. Widely patent spinal canal and neural
foramina.
IMPRESSION: 1. Normal thoracic spinal cord.
2. No disc herniation or evidence of neural impingement.

## 2018-12-07 ENCOUNTER — Other Ambulatory Visit: Payer: Self-pay | Admitting: Obstetrics and Gynecology

## 2018-12-07 ENCOUNTER — Other Ambulatory Visit (HOSPITAL_COMMUNITY): Payer: Self-pay | Admitting: *Deleted

## 2018-12-07 DIAGNOSIS — N6320 Unspecified lump in the left breast, unspecified quadrant: Secondary | ICD-10-CM

## 2018-12-13 ENCOUNTER — Telehealth (HOSPITAL_COMMUNITY): Payer: Self-pay | Admitting: *Deleted

## 2018-12-13 NOTE — Telephone Encounter (Signed)
Telephoned patient at home number and left message concerning [facility] appointment scheduled for April 23 11:00. Advised would need to speak to patient prior to appointment otherwise appointment would be canceled.

## 2018-12-13 NOTE — Telephone Encounter (Signed)
Patient returned call. Confirmed [facility] appointment for April 23. No symptoms of COVID-19. No contact with someone with a confirm diagnosis of COVID-19. No travel outside of [place] in the past 14 days.

## 2018-12-14 ENCOUNTER — Ambulatory Visit (HOSPITAL_COMMUNITY)
Admission: RE | Admit: 2018-12-14 | Discharge: 2018-12-14 | Disposition: A | Discharge status: Home / Self Care | Payer: Medicaid Other | Source: Ambulatory Visit | Attending: Obstetrics and Gynecology | Admitting: Obstetrics and Gynecology

## 2018-12-14 ENCOUNTER — Encounter (HOSPITAL_COMMUNITY): Payer: Self-pay

## 2018-12-14 ENCOUNTER — Other Ambulatory Visit: Payer: Self-pay

## 2018-12-14 VITALS — BP 104/68 | Temp 98.0°F | Ht 60.0 in | Wt 106.0 lb

## 2018-12-14 DIAGNOSIS — N6322 Unspecified lump in the left breast, upper inner quadrant: Secondary | ICD-10-CM

## 2018-12-14 DIAGNOSIS — N644 Mastodynia: Secondary | ICD-10-CM

## 2018-12-14 DIAGNOSIS — Z1239 Encounter for other screening for malignant neoplasm of breast: Secondary | ICD-10-CM

## 2018-12-14 NOTE — Patient Instructions (Signed)
Explained breast self awareness with [name]. Patient did not need a Pap smear today due to last Pap smear was 02/16/2017. Let her know [facility] will cover Pap smears every 3 years unless has a history of abnormal Pap smears. Referred patient to the [facility] for a left breast ultrasound. Appointment scheduled for Friday, December 15, 2018 at 1340. Patient aware of appointment and will be there. [name] verbalized understanding.  [name], [name], RN 10:57 AM

## 2018-12-14 NOTE — Progress Notes (Signed)
Complaints of a left breast lump x 3-4 weeks that has increased in size and is painful. Patient states the pain is constant and has increased over the past two weeks. Patient rates the pain at a 8 out of 10. Patient stated the lump is reddened and has increased over the past two weeks.  Pap Smear: Pap smear not completed today. Last Pap smear was 02/16/2017 at  and normal. Per patient has no history of an abnormal Pap smear. Last two Pap smear results are in Epic.  Physical exam: Breasts Breasts symmetrical. No skin abnormalities right breast. Left breast reddened between 9 o'clock and 1 o'clock that begins next to nipple area. No nipple retraction bilateral breasts. No nipple discharge bilateral breasts. No lymphadenopathy. No lumps palpated right breast. Palpated a mass within the left breast between 9 o'clock and 1 o'clock next to nipple. Complaints of pain when palpated left breast mass on exam. Referred patient to the [facility] for a left breast ultrasound. Appointment scheduled for Friday, December 15, 2018 at 1340.        Pelvic/Bimanual No Pap smear completed today since last Pap smear was 02/16/2017. Pap smear not indicated per [facility] guidelines.   Smoking History: Patient has never smoked.  Patient Navigation: Patient education provided. Access to services provided for patient through [facility] program.   Breast and Cervical Cancer Risk Assessment: Patient has a family history of her maternal grandmother having breast cancer. Patient has no known genetic mutations or history of radiation treatment to the chest before age 53. Patient has no history of cervical dysplasia, immunocompromised, or DES exposure in-utero. Breast cancer risk assessment completed. No breast cancer risk calculated due to patient is less than 76 years old.

## 2018-12-15 ENCOUNTER — Other Ambulatory Visit (HOSPITAL_COMMUNITY): Payer: Self-pay | Admitting: Obstetrics and Gynecology

## 2018-12-15 ENCOUNTER — Ambulatory Visit
Admission: RE | Admit: 2018-12-15 | Discharge: 2018-12-15 | Disposition: A | Discharge status: Home / Self Care | Payer: No Typology Code available for payment source | Source: Ambulatory Visit | Attending: Obstetrics and Gynecology | Admitting: Obstetrics and Gynecology

## 2018-12-15 DIAGNOSIS — N6320 Unspecified lump in the left breast, unspecified quadrant: Secondary | ICD-10-CM

## 2018-12-20 LAB — AEROBIC/ANAEROBIC CULTURE W GRAM STAIN (SURGICAL/DEEP WOUND): Special Requests: NORMAL

## 2018-12-20 LAB — AEROBIC/ANAEROBIC CULTURE (SURGICAL/DEEP WOUND)

## 2018-12-22 ENCOUNTER — Ambulatory Visit
Admission: RE | Admit: 2018-12-22 | Discharge: 2018-12-22 | Disposition: A | Discharge status: Home / Self Care | Payer: No Typology Code available for payment source | Source: Ambulatory Visit | Attending: Obstetrics and Gynecology | Admitting: Obstetrics and Gynecology

## 2018-12-22 ENCOUNTER — Other Ambulatory Visit (HOSPITAL_COMMUNITY): Payer: Self-pay | Admitting: Obstetrics and Gynecology

## 2018-12-22 ENCOUNTER — Other Ambulatory Visit: Payer: Self-pay

## 2018-12-22 DIAGNOSIS — N6320 Unspecified lump in the left breast, unspecified quadrant: Secondary | ICD-10-CM

## 2018-12-22 DIAGNOSIS — N611 Abscess of the breast and nipple: Secondary | ICD-10-CM

## 2018-12-29 ENCOUNTER — Other Ambulatory Visit: Payer: No Typology Code available for payment source

## 2019-01-02 ENCOUNTER — Ambulatory Visit
Admission: RE | Admit: 2019-01-02 | Discharge: 2019-01-02 | Disposition: A | Discharge status: Home / Self Care | Payer: No Typology Code available for payment source | Source: Ambulatory Visit | Attending: Obstetrics and Gynecology | Admitting: Obstetrics and Gynecology

## 2019-01-02 ENCOUNTER — Other Ambulatory Visit (HOSPITAL_COMMUNITY): Payer: Self-pay | Admitting: Obstetrics and Gynecology

## 2019-01-02 ENCOUNTER — Other Ambulatory Visit: Payer: Self-pay

## 2019-01-02 DIAGNOSIS — N611 Abscess of the breast and nipple: Secondary | ICD-10-CM

## 2019-01-08 ENCOUNTER — Encounter (HOSPITAL_COMMUNITY): Payer: Self-pay | Admitting: *Deleted

## 2019-01-16 ENCOUNTER — Other Ambulatory Visit: Payer: No Typology Code available for payment source

## 2019-02-01 ENCOUNTER — Ambulatory Visit
Admission: RE | Admit: 2019-02-01 | Discharge: 2019-02-01 | Disposition: A | Discharge status: Home / Self Care | Payer: Self-pay | Source: Ambulatory Visit | Attending: Obstetrics and Gynecology | Admitting: Obstetrics and Gynecology

## 2019-02-01 ENCOUNTER — Other Ambulatory Visit: Payer: Self-pay

## 2019-02-01 DIAGNOSIS — N611 Abscess of the breast and nipple: Secondary | ICD-10-CM

## 2019-03-15 ENCOUNTER — Emergency Department
Admission: EM | Admit: 2019-03-15 | Discharge: 2019-03-15 | Disposition: A | Discharge status: Home / Self Care | Payer: No Typology Code available for payment source | Attending: Emergency Medicine | Admitting: Emergency Medicine

## 2019-03-15 ENCOUNTER — Other Ambulatory Visit: Payer: Self-pay

## 2019-03-15 ENCOUNTER — Emergency Department: Payer: No Typology Code available for payment source

## 2019-03-15 ENCOUNTER — Encounter: Payer: Self-pay | Admitting: *Deleted

## 2019-03-15 DIAGNOSIS — J45909 Unspecified asthma, uncomplicated: Secondary | ICD-10-CM | POA: Insufficient documentation

## 2019-03-15 DIAGNOSIS — Z79899 Other long term (current) drug therapy: Secondary | ICD-10-CM | POA: Insufficient documentation

## 2019-03-15 DIAGNOSIS — R112 Nausea with vomiting, unspecified: Secondary | ICD-10-CM | POA: Insufficient documentation

## 2019-03-15 DIAGNOSIS — Z20822 Contact with and (suspected) exposure to covid-19: Secondary | ICD-10-CM

## 2019-03-15 DIAGNOSIS — M7918 Myalgia, other site: Secondary | ICD-10-CM | POA: Insufficient documentation

## 2019-03-15 DIAGNOSIS — R079 Chest pain, unspecified: Secondary | ICD-10-CM | POA: Insufficient documentation

## 2019-03-15 DIAGNOSIS — R0602 Shortness of breath: Secondary | ICD-10-CM | POA: Insufficient documentation

## 2019-03-15 DIAGNOSIS — R509 Fever, unspecified: Secondary | ICD-10-CM | POA: Insufficient documentation

## 2019-03-15 DIAGNOSIS — E039 Hypothyroidism, unspecified: Secondary | ICD-10-CM | POA: Insufficient documentation

## 2019-03-15 LAB — URINALYSIS, COMPLETE (UACMP) WITH MICROSCOPIC
Bacteria, UA: NONE SEEN
Bilirubin Urine: NEGATIVE
Glucose, UA: NEGATIVE mg/dL
Ketones, ur: NEGATIVE mg/dL
Nitrite: NEGATIVE
Protein, ur: NEGATIVE mg/dL
Specific Gravity, Urine: 1.003 — ABNORMAL LOW (ref 1.005–1.030)
pH: 8 (ref 5.0–8.0)

## 2019-03-15 LAB — CBC
HCT: 48.1 % — ABNORMAL HIGH (ref 36.0–46.0)
Hemoglobin: 16 g/dL — ABNORMAL HIGH (ref 12.0–15.0)
MCH: 28.8 pg (ref 26.0–34.0)
MCHC: 33.3 g/dL (ref 30.0–36.0)
MCV: 86.7 fL (ref 80.0–100.0)
Platelets: 258 10*3/uL (ref 150–400)
RBC: 5.55 MIL/uL — ABNORMAL HIGH (ref 3.87–5.11)
RDW: 11.8 % (ref 11.5–15.5)
WBC: 16.6 10*3/uL — ABNORMAL HIGH (ref 4.0–10.5)
nRBC: 0 % (ref 0.0–0.2)

## 2019-03-15 LAB — COMPREHENSIVE METABOLIC PANEL
ALT: 15 U/L (ref 0–44)
AST: 17 U/L (ref 15–41)
Albumin: 4.7 g/dL (ref 3.5–5.0)
Alkaline Phosphatase: 87 U/L (ref 38–126)
Anion gap: 12 (ref 5–15)
BUN: 8 mg/dL (ref 6–20)
CO2: 23 mmol/L (ref 22–32)
Calcium: 9.8 mg/dL (ref 8.9–10.3)
Chloride: 103 mmol/L (ref 98–111)
Creatinine, Ser: 0.54 mg/dL (ref 0.44–1.00)
GFR calc Af Amer: >60 mL/min (ref >60)
GFR calc non Af Amer: >60 mL/min (ref >60)
Glucose, Bld: 83 mg/dL (ref 70–99)
Potassium: 3.7 mmol/L (ref 3.5–5.1)
Sodium: 138 mmol/L (ref 135–145)
Total Bilirubin: 0.9 mg/dL (ref 0.3–1.2)
Total Protein: 8.5 g/dL — ABNORMAL HIGH (ref 6.5–8.1)

## 2019-03-15 LAB — POCT PREGNANCY, URINE: Preg Test, Ur: NEGATIVE

## 2019-03-15 MED ORDER — DOXYCYCLINE HYCLATE 100 MG PO CAPS: 100.0000 mg | ORAL_CAPSULE | Freq: Two times a day (BID) | ORAL | 0 refills | Status: AC

## 2019-03-15 MED ORDER — PREDNISONE 10 MG (21) PO TBPK: ORAL_TABLET | ORAL | 0 refills | Status: DC

## 2019-03-15 MED ORDER — ONDANSETRON HCL 4 MG PO TABS: 4.0000 mg | ORAL_TABLET | Freq: Three times a day (TID) | ORAL | 0 refills | Status: DC | PRN

## 2019-03-15 NOTE — ED Notes (Signed)
Pt states she feels like crap.  Pt has chills, bodyaches and cough.  Sx for 2-3 days.  Pt alert talking on cell phone.

## 2019-03-15 NOTE — Discharge Instructions (Signed)
Please seek medical attention for any high fevers, chest pain, shortness of breath, change in behavior, persistent vomiting, bloody stool or any other new or concerning symptoms.  

## 2019-03-15 NOTE — ED Triage Notes (Signed)
Pt reports n/v/d, body aches ,fever.  Sx for 3 days.  Pt saw a virtual doctor today and was started on alb inhaler, tessalon pearles.  Pt states no better.  Pt alert.

## 2019-03-15 NOTE — ED Provider Notes (Signed)
[facility] Emergency Department Provider Note   ____________________________________________   I have reviewed the triage vital signs and the nursing notes.   HISTORY  Chief Complaint Emesis and Influenza   History limited by: Not Limited   HPI Sandra Rich is a 28 y.o. female who presents to the emergency department today because of concerns for body aches, cough, chest pain, chills.  Symptoms have been ongoing for the past 2 or 3 days.  She was tested for COVID-19 today.  States she was also given prescription for [employer] and albuterol inhaler.  She has been taking albuterol inhaler without any significant relief.  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  Boyfriend has similar yet milder symptoms.   Records reviewed. Per medical record review patient has a history of asthma  Past Medical History:  Diagnosis Date  . Anxiety   . Asthma    childhood  . Eczema   . Medical history non-contributory   . 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 Thyroid disease    hypo    Patient Active Problem List   Diagnosis Date Noted  . 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  . Adjustment disorder, unspecified 10/09/2017  . Normal labor 10/06/2017  . Post term pregnancy at [redacted] weeks gestation 10/06/2017  . Pregnancy 10/06/2017  . Active labor at term 11/25/2012  . SVD (spontaneous vaginal delivery) 11/25/2012    Past Surgical History:  Procedure Laterality Date  . FRACTURE SURGERY    . RHINOPLASTY    . TONSILLECTOMY    . wisdom teeth removal      Prior to Admission medications   Medication Sig Start Date End Date Taking? Authorizing Provider  gabapentin (NEURONTIN) 300 MG capsule Take 1 capsule (300 mg total) by mouth 3 (three) times daily as needed (for neuropathic leg pain). Patient not taking: Reported on 10/27/2017 10/11/17   Allyn Kenner, DO  ibuprofen (ADVIL,MOTRIN) 600 MG tablet Take 1 tablet (600 mg total) by mouth  every 6 (six) hours. Patient not taking: Reported on 12/14/2018 10/11/17   Allyn Kenner, DO  Prenatal Vit-Fe Fumarate-FA (PRENATAL MULTIVITAMIN) TABS tablet Take 1 tablet by mouth daily at 12 noon.    [provider]    Allergies Morphine, Stadol [butorphanol], and Food color red [red dye]  Family History  Problem Relation Age of Onset  . Depression Brother   . Breast cancer Maternal Grandmother   . Hypertension Maternal Grandfather     Social History Social History   Tobacco Use  . Smoking status: Never Smoker  . Smokeless tobacco: Never Used  Substance Use Topics  . Alcohol use: Yes    Comment: rarely  . Drug use: No    Review of Systems Constitutional: Positive for chills Eyes: No visual changes. ENT: No sore throat. Cardiovascular: Positive for chest pain. Respiratory: Positive for shortness of breath. Gastrointestinal: No abdominal pain. Positive for nausea and vomiting. Genitourinary: Negative for dysuria. Musculoskeletal: Positive for body aches. Skin: Negative for rash. Neurological: Negative for headaches, focal weakness or numbness.  ____________________________________________   PHYSICAL EXAM:  VITAL SIGNS: ED Triage Vitals  Enc Vitals Group     BP 03/15/19 1518 116/81     Pulse Rate 03/15/19 1518 (!) 116     Resp 03/15/19 1518 (!) 22     Temp 03/15/19 1518 99.6 F (37.6 C)     Temp Source 03/15/19 1955 Oral     SpO2 03/15/19 1518 100 %  Weight --      Height --      Head Circumference --      Peak Flow --      Pain Score 03/15/19 1520 10   Constitutional: Alert and oriented.  Eyes: Conjunctivae are normal.  ENT      Head: Normocephalic and atraumatic.      Nose: No congestion/rhinnorhea.      Mouth/Throat: Mucous membranes are moist.      Neck: No stridor. Hematological/Lymphatic/Immunilogical: No cervical lymphadenopathy. Cardiovascular: Tachycardic, regular Sandra.  No murmurs, rubs, or gallops.  Respiratory: Normal  respiratory effort without tachypnea nor retractions. Breath sounds are clear and equal bilaterally. No wheezes/rales/rhonchi. Gastrointestinal: Soft and non tender. No rebound. No guarding.  Genitourinary: Deferred Musculoskeletal: Normal range of motion in all extremities. No lower extremity edema. Neurologic:  Normal speech and language. No gross focal neurologic deficits are appreciated.  Skin:  Skin is warm, dry and intact. No rash noted. Psychiatric: Mood and affect are normal. Speech and behavior are normal. Patient exhibits appropriate insight and judgment.  ____________________________________________    LABS (pertinent positives/negatives)  Upreg negative CMP wnl except t pro 8.5 UA clear, 0-5 rbc and wbc  ____________________________________________   EKG  None  ____________________________________________    RADIOLOGY  CXR Question atelectasis vs developing infiltrate  ____________________________________________   PROCEDURES  Procedures  ____________________________________________   INITIAL IMPRESSION / ASSESSMENT AND PLAN / ED COURSE  Pertinent labs & imaging results that were available during my care of the patient were reviewed by me and considered in my medical decision making (see chart for details).   Patient presented to the emergency department today with signs and symptoms consistent with COVID.  She was tested earlier today.  I had a discussion with the patient about expected course of COVID.  Discussed symptomatic care.  X-ray showed possible infiltrate.  At this point I have low suspicion for actual pneumonia.  Think likely related to [place] however out of abundance of caution will send patient home with antibiotics as well.   ____________________________________________   FINAL CLINICAL IMPRESSION(S) / ED DIAGNOSES  Final diagnoses:  Suspected Covid-19 Virus Infection     Note: This dictation was prepared with Dragon dictation. Any  transcriptional errors that result from this process are unintentional     Phineas SemenGoodman, Shamona Wirtz, MD 03/15/19 2053

## 2019-03-15 NOTE — ED Notes (Signed)
ED Provider at bedside. 

## 2019-03-15 NOTE — ED Notes (Signed)
poct pregnancy Negative 

## 2019-03-19 LAB — NOVEL CORONAVIRUS, NAA: SARS-CoV-2, NAA: NOT DETECTED

## 2019-10-08 ENCOUNTER — Ambulatory Visit: Payer: No Typology Code available for payment source | Attending: Internal Medicine

## 2021-03-14 ENCOUNTER — Emergency Department (HOSPITAL_COMMUNITY)
Admission: EM | Admit: 2021-03-14 | Discharge: 2021-03-14 | Disposition: A | Discharge status: Home / Self Care | Payer: BC Managed Care – PPO | Attending: Obstetrics and Gynecology | Admitting: Obstetrics and Gynecology

## 2021-03-14 ENCOUNTER — Encounter (HOSPITAL_COMMUNITY): Payer: Self-pay | Admitting: Obstetrics and Gynecology

## 2021-03-14 ENCOUNTER — Other Ambulatory Visit: Payer: Self-pay

## 2021-03-14 DIAGNOSIS — Z3A01 Less than 8 weeks gestation of pregnancy: Secondary | ICD-10-CM | POA: Diagnosis not present

## 2021-03-14 DIAGNOSIS — Z3A00 Weeks of gestation of pregnancy not specified: Secondary | ICD-10-CM

## 2021-03-14 DIAGNOSIS — O219 Vomiting of pregnancy, unspecified: Secondary | ICD-10-CM | POA: Diagnosis not present

## 2021-03-14 DIAGNOSIS — O210 Mild hyperemesis gravidarum: Secondary | ICD-10-CM | POA: Insufficient documentation

## 2021-03-14 DIAGNOSIS — Z885 Allergy status to narcotic agent status: Secondary | ICD-10-CM | POA: Diagnosis not present

## 2021-03-14 LAB — URINALYSIS, ROUTINE W REFLEX MICROSCOPIC
Bacteria, UA: NONE SEEN
Bilirubin Urine: NEGATIVE
Glucose, UA: NEGATIVE mg/dL
Hgb urine dipstick: NEGATIVE
Ketones, ur: 80 mg/dL — AB
Leukocytes,Ua: NEGATIVE
Nitrite: NEGATIVE
Protein, ur: 30 mg/dL — AB
Specific Gravity, Urine: 1.021 (ref 1.005–1.030)
pH: 9 — ABNORMAL HIGH (ref 5.0–8.0)

## 2021-03-14 LAB — I-STAT BETA HCG BLOOD, ED (MC, WL, AP ONLY): I-stat hCG, quantitative: >2000 m[IU]/mL — ABNORMAL HIGH (ref <5)

## 2021-03-14 MED ORDER — SODIUM CHLORIDE 0.9 % IV SOLN
25.0000 mg | Freq: Once | INTRAVENOUS | Status: AC
  Administered 2021-03-14: 25 mg via INTRAVENOUS
  Filled 2021-03-14: qty 1

## 2021-03-14 MED ORDER — LACTATED RINGERS IV BOLUS
1000.0000 mL | Freq: Once | INTRAVENOUS | Status: AC
  Administered 2021-03-14: 1000 mL via INTRAVENOUS

## 2021-03-14 MED ORDER — SCOPOLAMINE 1 MG/3DAYS TD PT72
1.0000 | MEDICATED_PATCH | Freq: Once | TRANSDERMAL | Status: DC
  Administered 2021-03-14: 1.5 mg via TRANSDERMAL
  Filled 2021-03-14: qty 1

## 2021-03-14 MED ORDER — SCOPOLAMINE 1 MG/3DAYS TD PT72: 1.0000 | MEDICATED_PATCH | TRANSDERMAL | 0 refills | Status: AC

## 2021-03-14 MED ORDER — FAMOTIDINE IN NACL 20-0.9 MG/50ML-% IV SOLN
20.0000 mg | Freq: Once | INTRAVENOUS | Status: AC
  Administered 2021-03-14: 20 mg via INTRAVENOUS
  Filled 2021-03-14: qty 50

## 2021-03-14 MED ORDER — METOCLOPRAMIDE HCL 10 MG PO TABS: 10.0000 mg | ORAL_TABLET | Freq: Three times a day (TID) | ORAL | 0 refills | Status: DC | PRN

## 2021-03-14 NOTE — MAU Note (Signed)
Pt reports to mau from [facility] with c/o nausea and vomiting that started yesterday.  Pt reports irregular periods due to PCOS and had pos hpt yesterday.  Denies vag bleeding or pain.  Reports emesis x 6 today

## 2021-03-14 NOTE — ED Provider Notes (Signed)
Emergency Medicine Provider OB Triage Evaluation Note  Sandra Rich is a 30 y.o. female, G2P2002, at Unknown gestation who presents to the emergency department with complaints of nausea and vomiting.  Unable to tolerate any significant amount of p.o. in the past 24 hours.  She took 4 pregnancy test at home yesterday, all of which were positive.  Has a history PCOS, so this does not have regular periods, last menstruation was in January February of this year.  She is not on any contraception.  She takes no medications daily.  Has not taken anything for her symptoms.  No vaginal bleeding, abdominal pain, vaginal discharge, fevers.  Review of  Systems  Positive: n/v Negative: abd pain  Physical Exam  BP 116/68   Pulse 89   Temp 98.9 F (37.2 C) (Oral)   Resp 16   SpO2 100%  General: Awake, no distress  HEENT: Atraumatic  Resp: Normal effort  Cardiac: Normal rate Abd: Nondistended, nontender  MSK: Moves all extremities without difficulty Neuro: Speech clear  Medical Decision Making  Pt evaluated for pregnancy concern and is stable for transfer to MAU. Pt is in agreement with plan for transfer.  10:00 AM Discussed with MAU APP, Denny Peon, who accepts patient in transfer.  Clinical Impression  No diagnosis found.     Alveria Apley, PA-C 03/14/21 1002    Tegeler, Canary Brim, MD 03/14/21 [phone]

## 2021-03-14 NOTE — MAU Provider Note (Signed)
History     CSN: [id number]  Arrival date and time: 03/14/21 1638   Event Date/Time   First Provider Initiated Contact with Patient 03/14/21 1110      Chief Complaint  Patient presents with   Nausea   Emesis   HPI Sandra Rich is a 30 y.o. G3P2002 at Unknown who presents with nausea & vomiting. Symptoms started yesterday when she had her first positive pregnancy testy. Reports vomiting 7 times this morning; last vomited about 930 am. Does not have antiemetic at home & hasn't treated her symptoms. Denies fever, diarrhea, abdominal pain, or vaginal bleeding. She has an appointment with [facility] ob on 8/3.  OB History     Gravida  3   Para  2   Term  2   Preterm      AB      Living  2      SAB      IAB      Ectopic      Multiple  0   Live Births  2           Past Medical History:  Diagnosis Date   Anxiety    Asthma    childhood   Eczema    Mono exposure    Pt had Mono.   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   Thyroid disease    hypo    Past Surgical History:  Procedure Laterality Date   FRACTURE SURGERY     RHINOPLASTY     TONSILLECTOMY     wisdom teeth removal      Family History  Problem Relation Age of Onset   Depression Brother    Breast cancer Maternal Grandmother    Hypertension Maternal Grandfather     Social History   Tobacco Use   Smoking status: Never   Smokeless tobacco: Never  Vaping Use   Vaping Use: Never used  Substance Use Topics   Alcohol use: Yes    Comment: rarely   Drug use: No    Allergies:  Allergies  Allergen Reactions   Morphine Swelling and Other (See Comments)    Reaction:  All over body swelling   Stadol [Butorphanol] Other (See Comments)    Dizziness and blurred vision   Food Color Red [Red Dye] Itching, Swelling and Other (See Comments)    Reaction:  Lip swelling    Medications Prior to Admission  Medication Sig Dispense Refill Last Dose   gabapentin (NEURONTIN) 300 MG capsule Take 1  capsule (300 mg total) by mouth 3 (three) times daily as needed (for neuropathic leg pain). (Patient not taking: Reported on 10/27/2017) 90 capsule 0    ibuprofen (ADVIL,MOTRIN) 600 MG tablet Take 1 tablet (600 mg total) by mouth every 6 (six) hours. (Patient not taking: Reported on 12/14/2018) 30 tablet 0    ondansetron (ZOFRAN) 4 MG tablet Take 1 tablet (4 mg total) by mouth every 8 (eight) hours as needed. 20 tablet 0    predniSONE (STERAPRED UNI-PAK 21 TAB) 10 MG (21) TBPK tablet Per packaging instructions 21 tablet 0    Prenatal Vit-Fe Fumarate-FA (PRENATAL MULTIVITAMIN) TABS tablet Take 1 tablet by mouth daily at 12 noon.       Review of Systems  Constitutional: Negative.   Gastrointestinal:  Positive for nausea and vomiting. Negative for abdominal pain and diarrhea.  Genitourinary: Negative.   Physical Exam   Blood pressure 123/78, pulse 75, temperature 98.8 F (37.1 C), temperature source Oral, resp. rate 16, weight  59.9 kg, SpO2 100 %, unknown if currently breastfeeding.  Physical Exam Vitals and nursing note reviewed.  Constitutional:      General: She is not in acute distress.    Appearance: Normal appearance.  HENT:     Head: Normocephalic and atraumatic.  Eyes:     General: No scleral icterus. Pulmonary:     Effort: Pulmonary effort is normal. No respiratory distress.  Abdominal:     General: Abdomen is flat.     Palpations: Abdomen is soft.     Tenderness: There is no abdominal tenderness.  Skin:    General: Skin is warm and dry.  Neurological:     Mental Status: She is alert.  Psychiatric:        Mood and Affect: Mood normal.        Behavior: Behavior normal.    MAU Course  Procedures Results for orders placed or performed during the hospital encounter of 03/14/21 (from the past 24 hour(s))  I-Stat Beta hCG blood, ED ([facility], [facility], [facility] only)     Status: Abnormal   Collection Time: 03/14/21  9:47 AM  Result Value Ref Range   I-stat hCG, quantitative >2,000.0 (H) <5  mIU/mL   Comment 3          Urinalysis, Routine w reflex microscopic Urine, Clean Catch     Status: Abnormal   Collection Time: 03/14/21 11:09 AM  Result Value Ref Range   Color, Urine YELLOW YELLOW   APPearance HAZY (A) CLEAR   Specific Gravity, Urine 1.021 1.005 - 1.030   pH 9.0 (H) 5.0 - 8.0   Glucose, UA NEGATIVE NEGATIVE mg/dL   Hgb urine dipstick NEGATIVE NEGATIVE   Bilirubin Urine NEGATIVE NEGATIVE   Ketones, ur 80 (A) NEGATIVE mg/dL   Protein, ur 30 (A) NEGATIVE mg/dL   Nitrite NEGATIVE NEGATIVE   Leukocytes,Ua NEGATIVE NEGATIVE   RBC / HPF 0-5 0 - 5 RBC/hpf   WBC, UA 0-5 0 - 5 WBC/hpf   Bacteria, UA NONE SEEN NONE SEEN   Squamous Epithelial / LPF 6-10 0 - 5   Mucus PRESENT     MDM Istat HCG positive in the ED prior to pt being transferred to MAU. She denies abdominal pain or vaginal bleeding; she has irregular cycles & is unsure of her LMP> Fundus not appreciated on exam so suspect that she is still early in the pregnancy. Further pregnancy evaluation not indicated at this time. She has an appointment with [facility] in <2 weeks.   No vomiting in MAU but U/a does indicate dehydration. Offered IV fluids & meds - pt is agreeable. She is also requesting the nausea patch - scopolamine patch applied.  IV LR bolus, phenergan, & pepcid given Tolerated POs and no vomiting. Will d/c home with rx antiemetics.   Assessment and Plan   1. Nausea and vomiting during pregnancy prior to [redacted] weeks gestation    -rx scop patch & reglan  -f/u with [facility] as scheduled  Judeth Horn 03/14/2021, 1:28 PM

## 2021-03-17 ENCOUNTER — Inpatient Hospital Stay (HOSPITAL_COMMUNITY)
Admission: AD | Admit: 2021-03-17 | Discharge: 2021-03-17 | Disposition: A | Discharge status: Home / Self Care | Payer: BC Managed Care – PPO | Attending: Obstetrics and Gynecology | Admitting: Obstetrics and Gynecology

## 2021-03-17 ENCOUNTER — Inpatient Hospital Stay (HOSPITAL_COMMUNITY): Payer: BC Managed Care – PPO

## 2021-03-17 DIAGNOSIS — Z3A01 Less than 8 weeks gestation of pregnancy: Secondary | ICD-10-CM

## 2021-03-17 DIAGNOSIS — O4690 Antepartum hemorrhage, unspecified, unspecified trimester: Secondary | ICD-10-CM

## 2021-03-17 DIAGNOSIS — O4691 Antepartum hemorrhage, unspecified, first trimester: Secondary | ICD-10-CM | POA: Diagnosis not present

## 2021-03-17 DIAGNOSIS — O26851 Spotting complicating pregnancy, first trimester: Secondary | ICD-10-CM | POA: Insufficient documentation

## 2021-03-17 DIAGNOSIS — Z6790 Unspecified blood type, Rh positive: Secondary | ICD-10-CM | POA: Diagnosis not present

## 2021-03-17 DIAGNOSIS — Z79899 Other long term (current) drug therapy: Secondary | ICD-10-CM | POA: Insufficient documentation

## 2021-03-17 LAB — CBC WITH DIFFERENTIAL/PLATELET
Abs Immature Granulocytes: 0.02 10*3/uL (ref 0.00–0.07)
Basophils Absolute: 0 10*3/uL (ref 0.0–0.1)
Basophils Relative: 0 %
Eosinophils Absolute: 0.1 10*3/uL (ref 0.0–0.5)
Eosinophils Relative: 1 %
HCT: 42.5 % (ref 36.0–46.0)
Hemoglobin: 14.9 g/dL (ref 12.0–15.0)
Immature Granulocytes: 0 %
Lymphocytes Relative: 23 %
Lymphs Abs: 2.2 10*3/uL (ref 0.7–4.0)
MCH: 30.3 pg (ref 26.0–34.0)
MCHC: 35.1 g/dL (ref 30.0–36.0)
MCV: 86.6 fL (ref 80.0–100.0)
Monocytes Absolute: 0.7 10*3/uL (ref 0.1–1.0)
Monocytes Relative: 8 %
Neutro Abs: 6.5 10*3/uL (ref 1.7–7.7)
Neutrophils Relative %: 68 %
Platelets: 293 10*3/uL (ref 150–400)
RBC: 4.91 MIL/uL (ref 3.87–5.11)
RDW: 11.6 % (ref 11.5–15.5)
WBC: 9.6 10*3/uL (ref 4.0–10.5)
nRBC: 0 % (ref 0.0–0.2)

## 2021-03-17 LAB — COMPREHENSIVE METABOLIC PANEL
ALT: 18 U/L (ref 0–44)
AST: 15 U/L (ref 15–41)
Albumin: 3.8 g/dL (ref 3.5–5.0)
Alkaline Phosphatase: 56 U/L (ref 38–126)
Anion gap: 8 (ref 5–15)
BUN: 6 mg/dL (ref 6–20)
CO2: 24 mmol/L (ref 22–32)
Calcium: 9.2 mg/dL (ref 8.9–10.3)
Chloride: 105 mmol/L (ref 98–111)
Creatinine, Ser: 0.56 mg/dL (ref 0.44–1.00)
GFR, Estimated: >60 mL/min (ref >60)
Glucose, Bld: 85 mg/dL (ref 70–99)
Potassium: 4.1 mmol/L (ref 3.5–5.1)
Sodium: 137 mmol/L (ref 135–145)
Total Bilirubin: 0.6 mg/dL (ref 0.3–1.2)
Total Protein: 7.1 g/dL (ref 6.5–8.1)

## 2021-03-17 LAB — HCG, QUANTITATIVE, PREGNANCY: hCG, Beta Chain, Quant, S: 120103 m[IU]/mL — ABNORMAL HIGH (ref <5)

## 2021-03-17 LAB — WET PREP, GENITAL
Sperm: NONE SEEN
Trich, Wet Prep: NONE SEEN
Yeast Wet Prep HPF POC: NONE SEEN

## 2021-03-17 NOTE — MAU Provider Note (Signed)
History     CSN: [id number]  Arrival date and time: 03/17/21 1508   None     Chief Complaint  Patient presents with   Vaginal Bleeding   Sandra Rich is a 30 y.o. G3P2002 at 1076w5d who presents to MAU for single episode of orange/pink discharge when wiping after using the restroom. Patient reports this did not happen during her other two pregnancies and she was concerned. Patient denies pain or bleeding at this time.  Pt denies VB, LOF, ctx, decreased FM, vaginal discharge/odor/itching. Pt denies N/V, abdominal pain, constipation, diarrhea, or urinary problems. Pt denies fever, chills, fatigue, sweating or changes in appetite. Pt denies SOB or chest pain. Pt denies dizziness, HA, light-headedness, weakness.   OB History     Gravida  3   Para  2   Term  2   Preterm      AB      Living  2      SAB      IAB      Ectopic      Multiple  0   Live Births  2           Past Medical History:  Diagnosis Date   Anxiety    Asthma    childhood   Eczema    Mono exposure    Pt had Mono.   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   Thyroid disease    hypo    Past Surgical History:  Procedure Laterality Date   FRACTURE SURGERY     RHINOPLASTY     TONSILLECTOMY     wisdom teeth removal      Family History  Problem Relation Age of Onset   Depression Brother    Breast cancer Maternal Grandmother    Hypertension Maternal Grandfather     Social History   Tobacco Use   Smoking status: Never   Smokeless tobacco: Never  Vaping Use   Vaping Use: Never used  Substance Use Topics   Alcohol use: Yes    Comment: rarely   Drug use: No    Allergies:  Allergies  Allergen Reactions   Morphine Swelling and Other (See Comments)    Reaction:  All over body swelling   Stadol [Butorphanol] Other (See Comments)    Dizziness and blurred vision   Food Color Red [Red Dye] Itching, Swelling and Other (See Comments)    Reaction:  Lip swelling    Medications Prior  to Admission  Medication Sig Dispense Refill Last Dose   metoCLOPramide (REGLAN) 10 MG tablet Take 1 tablet (10 mg total) by mouth every 8 (eight) hours as needed for nausea. 30 tablet 0    Prenatal Vit-Fe Fumarate-FA (PRENATAL MULTIVITAMIN) TABS tablet Take 1 tablet by mouth daily at 12 noon.      scopolamine (TRANSDERM-SCOP, 1.5 MG,) 1 MG/3DAYS Place 1 patch (1.5 mg total) onto the skin every 3 (three) days for 12 days. 4 patch 0     Review of Systems  Constitutional:  Negative for chills, diaphoresis, fatigue and fever.  Eyes:  Negative for visual disturbance.  Respiratory:  Negative for shortness of breath.   Cardiovascular:  Negative for chest pain.  Gastrointestinal:  Negative for abdominal pain, constipation, diarrhea, nausea and vomiting.  Genitourinary:  Negative for dysuria, flank pain, frequency, pelvic pain, urgency, vaginal bleeding and vaginal discharge.  Neurological:  Negative for dizziness, weakness, light-headedness and headaches.   Physical Exam   Blood pressure 121/70, pulse 75, temperature 98.3 F (  36.8 C), temperature source Oral, resp. rate 16, height 5' (1.524 m), weight 60.1 kg, SpO2 98 %, unknown if currently breastfeeding.  Patient Vitals for the past 24 hrs:  BP Temp Temp src Pulse Resp SpO2 Height Weight  03/17/21 1727 121/70 98.3 F (36.8 C) Oral 75 16 98 % 5' (1.524 m) 60.1 kg   Physical Exam Vitals and nursing note reviewed.  Constitutional:      General: She is not in acute distress.    Appearance: Normal appearance. She is not ill-appearing, toxic-appearing or diaphoretic.  HENT:     Head: Normocephalic and atraumatic.  Pulmonary:     Effort: Pulmonary effort is normal.  Neurological:     Mental Status: She is alert and oriented to person, place, and time.  Psychiatric:        Mood and Affect: Mood normal.        Behavior: Behavior normal.        Thought Content: Thought content normal.        Judgment: Judgment normal.   Results for orders  placed or performed during the hospital encounter of 03/17/21 (from the past 24 hour(s))  CBC with Differential/Platelet     Status: None   Collection Time: 03/17/21  3:23 PM  Result Value Ref Range   WBC 9.6 4.0 - 10.5 K/uL   RBC 4.91 3.87 - 5.11 MIL/uL   Hemoglobin 14.9 12.0 - 15.0 g/dL   HCT 00.8 67.6 - 19.5 %   MCV 86.6 80.0 - 100.0 fL   MCH 30.3 26.0 - 34.0 pg   MCHC 35.1 30.0 - 36.0 g/dL   RDW 09.3 26.7 - 12.4 %   Platelets 293 150 - 400 K/uL   nRBC 0.0 0.0 - 0.2 %   Neutrophils Relative % 68 %   Neutro Abs 6.5 1.7 - 7.7 K/uL   Lymphocytes Relative 23 %   Lymphs Abs 2.2 0.7 - 4.0 K/uL   Monocytes Relative 8 %   Monocytes Absolute 0.7 0.1 - 1.0 K/uL   Eosinophils Relative 1 %   Eosinophils Absolute 0.1 0.0 - 0.5 K/uL   Basophils Relative 0 %   Basophils Absolute 0.0 0.0 - 0.1 K/uL   Immature Granulocytes 0 %   Abs Immature Granulocytes 0.02 0.00 - 0.07 K/uL  Comprehensive metabolic panel     Status: None   Collection Time: 03/17/21  3:23 PM  Result Value Ref Range   Sodium 137 135 - 145 mmol/L   Potassium 4.1 3.5 - 5.1 mmol/L   Chloride 105 98 - 111 mmol/L   CO2 24 22 - 32 mmol/L   Glucose, Bld 85 70 - 99 mg/dL   BUN 6 6 - 20 mg/dL   Creatinine, Ser 5.80 0.44 - 1.00 mg/dL   Calcium 9.2 8.9 - 99.8 mg/dL   Total Protein 7.1 6.5 - 8.1 g/dL   Albumin 3.8 3.5 - 5.0 g/dL   AST 15 15 - 41 U/L   ALT 18 0 - 44 U/L   Alkaline Phosphatase 56 38 - 126 U/L   Total Bilirubin 0.6 0.3 - 1.2 mg/dL   GFR, Estimated >33 >82 mL/min   Anion gap 8 5 - 15  hCG, quantitative, pregnancy     Status: Abnormal   Collection Time: 03/17/21  3:23 PM  Result Value Ref Range   hCG, Beta Chain, Quant, S 120,103 (H) <5 mIU/mL  Wet prep, genital     Status: Abnormal   Collection Time: 03/17/21  5:38  PM   Specimen: Vaginal  Result Value Ref Range   Yeast Wet Prep HPF POC NONE SEEN NONE SEEN   Trich, Wet Prep NONE SEEN NONE SEEN   Clue Cells Wet Prep HPF POC PRESENT (A) NONE SEEN   WBC, Wet  Prep HPF POC MANY (A) NONE SEEN   Sperm NONE SEEN    US OB LESS THAN 14 WEEKS WITH OB TRANSVAGINAL  Result Date: 03/17/2021 CLINICAL DATA:  Initial evaluation for acute vaginal bleeding, early pregnancy. EXAM: OBSTETRIC <14 WK [place] AND TRANSVAGINAL OB US TECHNIQUE: Both transabdominal and transvaginal ultrasound examinations were performed for complete evaluation of the gestation as well as the maternal uterus, adnexal regions, and pelvic cul-de-sac. Transvaginal technique was performed to assess early pregnancy. COMPARISON:  Lumbar well. FINDINGS: Intrauterine gestational sac: Single Yolk sac:  Present Embryo:  Present Cardiac Activity: Present Heart Rate: 121 bpm CRL: 8.8 mm   6 w   5 d                  [place] EDC: 11/05/2021 Subchorionic hemorrhage:  None visualized. Maternal uterus/adnexae: Ovaries within normal limits. Small corpus luteal cyst noted on the right. No adnexal mass or free fluid. IMPRESSION: 1. Single viable intrauterine pregnancy as above, estimated gestational age [redacted] weeks and 5 days by crown-rump length, with ultrasound EDC of 10/08/2021. No complication. 2. No other acute maternal uterine or adnexal abnormality. Electronically Signed   By: Rise Mu M.D.   On: 03/17/2021 19:58    MAU Course  Procedures  MDM -r/o ectopic -CBC: WNL -CMP: WNL -[place]: single IUP, +yolk sac, +embryo, FHR 121, [redacted]w[redacted]d, no SCH -hCG: 120,103 -ABO: B Positive -WetPrep: +ClueCells (isolated finding not requiring treatment) -GC/CT collected -pt discharged to home in stable condition  Orders Placed This Encounter  Procedures   Wet prep, genital    Standing Status:   Standing    Number of Occurrences:   1   US OB LESS THAN 14 WEEKS WITH OB TRANSVAGINAL    Standing Status:   Standing    Number of Occurrences:   1    Order Specific Question:   Symptom/Reason for Exam    Answer:   Vaginal bleeding in pregnancy [[id number]]   CBC with Differential/Platelet    Standing Status:   Standing    Number of  Occurrences:   1   Comprehensive metabolic panel    Standing Status:   Standing    Number of Occurrences:   1   hCG, quantitative, pregnancy    Standing Status:   Standing    Number of Occurrences:   1   Discharge patient    Order Specific Question:   Discharge disposition    Answer:   01-Home or Self Care [1]    Order Specific Question:   Discharge patient date    Answer:   03/17/2021   No orders of the defined types were placed in this encounter.  Assessment and Plan   1. Vaginal bleeding in pregnancy   2. [redacted] weeks gestation of pregnancy   3. Blood type, Rh positive    Allergies as of 03/17/2021       Reactions   Morphine Swelling, Other (See Comments)   Reaction:  All over body swelling   Stadol [butorphanol] Other (See Comments)   Dizziness and blurred vision   Food Color Red [red Dye] Itching, Swelling, Other (See Comments)   Reaction:  Lip swelling        Medication  List     TAKE these medications    metoCLOPramide 10 MG tablet Commonly known as: REGLAN Take 1 tablet (10 mg total) by mouth every 8 (eight) hours as needed for nausea.   prenatal multivitamin Tabs tablet Take 1 tablet by mouth daily at 12 noon.   scopolamine 1 MG/3DAYS Commonly known as: Transderm-Scop (1.5 MG) Place 1 patch (1.5 mg total) onto the skin every 3 (three) days for 12 days.       -start [facility] -safe meds in pregnancy list given -return MAU precautions given -pt discharged to home in stable condition  Joni Reining E Kaylie Ritter 03/17/2021, 8:28 PM

## 2021-03-17 NOTE — MAU Note (Signed)
Woke up around 1430, from a nap.  Had some d/c, orangish red.  Has not seen any since. No pain.  Was concerned, didn't know if this was normal.  Just found out preg a few days ago. Has appt for first [place] on 8/4.

## 2021-03-17 NOTE — Discharge Instructions (Signed)
Safe Medications in Pregnancy    Acne: Benzoyl Peroxide Salicylic Acid  Backache/Headache: Tylenol: 2 regular strength every 4 hours OR              2 Extra strength every 6 hours  Colds/Coughs/Allergies: Benadryl (alcohol free) 25 mg every 6 hours as needed Breath right strips Claritin Cepacol throat lozenges Chloraseptic throat spray Cold-Eeze- up to three times per day Cough drops, alcohol free Flonase (by prescription only) Guaifenesin Mucinex Robitussin DM (plain only, alcohol free) Saline nasal spray/drops Sudafed (pseudoephedrine) & Actifed ** use only after [redacted] weeks gestation and if you do not have high blood pressure Tylenol Vicks Vaporub Zinc lozenges Zyrtec   Constipation: Colace Ducolax suppositories Fleet enema Glycerin suppositories Metamucil Milk of magnesia Miralax Senokot Smooth move tea  Diarrhea: Kaopectate Imodium A-D  *NO pepto Bismol  Hemorrhoids: Anusol Anusol HC Preparation H Tucks  Indigestion: Tums Maalox Mylanta Zantac  Pepcid  Insomnia: Benadryl (alcohol free) 25mg every 6 hours as needed Tylenol PM Unisom, no Gelcaps  Leg Cramps: Tums MagGel  Nausea/Vomiting:  Bonine Dramamine Emetrol Ginger extract Sea bands Meclizine  Nausea medication to take during pregnancy:  Unisom (doxylamine succinate 25 mg tablets) Take one tablet daily at bedtime. If symptoms are not adequately controlled, the dose can be increased to a maximum recommended dose of two tablets daily (1/2 tablet in the morning, 1/2 tablet mid-afternoon and one at bedtime). Vitamin B6 100mg tablets. Take one tablet twice a day (up to 200 mg per day).  Skin Rashes: Aveeno products Benadryl cream or 25mg every 6 hours as needed Calamine Lotion 1% cortisone cream  Yeast infection: Gyne-lotrimin 7 Monistat 7   **If taking multiple medications, please check labels to avoid duplicating the same active ingredients **take  medication as directed on the label ** Do not exceed 4000 mg of tylenol in 24 hours **Do not take medications that contain aspirin or ibuprofen         Prenatal Care Providers           [facility] - accepts patients without insurance  Phone: [phone]  [facility] @ [facility]   Phone: [phone]  [facility] @[place]       Phone: [phone]            [facility] @ [place]     Phone: [phone]          [facility] @ [place]   Phone: [phone]  [facility] @ [facility] - accepts patients without insurance  Phone: [phone]  [facility] @ [facility] Phone: [phone]     [facility] - accepts patients without insurance Phone: [phone]  [facility]  Phone: [phone]  [facility] Phone: [phone]  [facility] Phone: [phone]  [facility] Phone: [phone]  [facility] Phone: [phone]  [facility]  Phone: [phone]  

## 2021-03-18 LAB — GC/CHLAMYDIA PROBE AMP (~~LOC~~) NOT AT ARMC
Chlamydia: NEGATIVE
Comment: NEGATIVE
Comment: NORMAL
Neisseria Gonorrhea: NEGATIVE

## 2021-04-22 LAB — OB RESULTS CONSOLE ABO/RH
RH Type: POSITIVE
RH Type: POSITIVE

## 2021-04-22 LAB — OB RESULTS CONSOLE HIV ANTIBODY (ROUTINE TESTING)
HIV: NONREACTIVE
HIV: NONREACTIVE

## 2021-04-22 LAB — OB RESULTS CONSOLE RUBELLA ANTIBODY, IGM
Rubella: IMMUNE
Rubella: IMMUNE

## 2021-04-22 LAB — OB RESULTS CONSOLE GC/CHLAMYDIA
Chlamydia: NEGATIVE
Chlamydia: NEGATIVE
Gonorrhea: NEGATIVE
Gonorrhea: NEGATIVE

## 2021-04-22 LAB — OB RESULTS CONSOLE HEPATITIS B SURFACE ANTIGEN
Hepatitis B Surface Ag: NEGATIVE
Hepatitis B Surface Ag: NEGATIVE

## 2021-04-22 LAB — OB RESULTS CONSOLE ANTIBODY SCREEN
Antibody Screen: NEGATIVE
Antibody Screen: NEGATIVE

## 2021-04-22 LAB — OB RESULTS CONSOLE RPR
RPR: NONREACTIVE
RPR: NONREACTIVE

## 2021-04-22 LAB — HEPATITIS C ANTIBODY
HCV Ab: NEGATIVE
HCV Ab: NEGATIVE

## 2021-05-30 ENCOUNTER — Other Ambulatory Visit: Payer: Self-pay

## 2021-05-30 ENCOUNTER — Emergency Department (HOSPITAL_COMMUNITY)
Admission: EM | Admit: 2021-05-30 | Discharge: 2021-05-31 | Disposition: A | Discharge status: Home / Self Care | Payer: Medicaid Other | Attending: Emergency Medicine | Admitting: Emergency Medicine

## 2021-05-30 ENCOUNTER — Emergency Department (HOSPITAL_COMMUNITY): Payer: Medicaid Other

## 2021-05-30 DIAGNOSIS — O26892 Other specified pregnancy related conditions, second trimester: Secondary | ICD-10-CM | POA: Insufficient documentation

## 2021-05-30 DIAGNOSIS — R0981 Nasal congestion: Secondary | ICD-10-CM | POA: Insufficient documentation

## 2021-05-30 DIAGNOSIS — Z20822 Contact with and (suspected) exposure to covid-19: Secondary | ICD-10-CM | POA: Diagnosis not present

## 2021-05-30 DIAGNOSIS — R0602 Shortness of breath: Secondary | ICD-10-CM | POA: Diagnosis not present

## 2021-05-30 DIAGNOSIS — J3489 Other specified disorders of nose and nasal sinuses: Secondary | ICD-10-CM | POA: Insufficient documentation

## 2021-05-30 DIAGNOSIS — Z3A17 17 weeks gestation of pregnancy: Secondary | ICD-10-CM | POA: Diagnosis not present

## 2021-05-30 DIAGNOSIS — Z5321 Procedure and treatment not carried out due to patient leaving prior to being seen by health care provider: Secondary | ICD-10-CM | POA: Diagnosis not present

## 2021-05-30 LAB — RESP PANEL BY RT-PCR (FLU A&B, COVID) ARPGX2
Influenza A by PCR: NEGATIVE
Influenza B by PCR: NEGATIVE
SARS Coronavirus 2 by RT PCR: NEGATIVE

## 2021-05-30 NOTE — ED Provider Notes (Addendum)
Emergency Medicine Provider Triage Evaluation Note  Sandra Rich , Rich 30 y.o. female  was evaluated in triage. G3P2> Pt complains of shortness of breath.  Began 3 to 4 days ago.  Also associated rhinorrhea, congestion. States typically needs inhalers when her symptoms are this way.  She is currently [redacted] weeks pregnant.  Had COVID test outpatient yesterday however is not resulted.  Has some tightness across her chest which is nonexertional nonpleuritic in nature.  She relates this to inability to take Rich deep breath.  She has no lower extremity swelling, no history of PE or DVT.  He has no abdominal pain, fluid leakage or vaginal bleeding.  States she occasionally feels flutters to her abdomen from baby.  No complications in pregnancy thus far.  Review of Systems  Positive: SOB, chest tightness, congestion, rhinorrhea Negative: Abd pain, vag bleeding, fluid leakage  Physical Exam  BP 115/81 (BP Location: Left Arm)   Pulse (!) 114   Temp 98.7 F (37.1 C) (Oral)   Resp 18   LMP  (LMP Unknown) Comment: hx of irreg cycles.- has PCOS  SpO2 100%  Gen:   Awake, no distress   ENT:  Congestion to Bl nares, mild tenderness over max sinuses Bl Resp:  Normal effort, Mild expiratory wheeze MSK:   Moves extremities without difficulty, comparments soft, no calf tenderness ABD:   Gravid uterus just under umbilicus. FHT 152 Other:    Medical Decision Making  Medically screening exam initiated at 8:45 PM.  Appropriate orders placed.  Sandra Rich was informed that the remainder of the evaluation will be completed by another provider, this initial triage assessment does not replace that evaluation, and the importance of remaining in the ED until their evaluation is complete.  UR complaints, wheeze  17 weeks preg however no abd pain, vag bleeding, emesis, fluid leakage. FHT 145-152       Sandra Rich A, PA-C 05/30/21 5400    Mancel Bale, MD 05/31/21 [phone]

## 2021-05-30 NOTE — ED Triage Notes (Signed)
Pt c/o 3 days of nasal congestion, headache, sore throat, generalized lower body aches and difficulty breathing. History of asthma. States she has not taken any medications d/t concerns for pregnancy, currently [redacted]w[redacted]d, without complications. FHT in triage 152.

## 2021-05-31 NOTE — ED Notes (Signed)
Pt name called for updated vitals, no response 

## 2021-05-31 NOTE — ED Notes (Signed)
Pt name called 3x for updated vitals, no response  

## 2021-08-23 NOTE — L&D Delivery Note (Signed)
Delivery Note ?Patient pushed for approximately 10 minutes without epidural.  At 10:21 AM a viable female was delivered via Vaginal, Spontaneous (Presentation: Right Occiput Anterior).  Delivery of the head was noted to be slow.  The bed was fully reclined.  The left anterior shoulder did not easily delivery.  A shoulder dystocia was noted.  The patient was instructed to stop pushing.  McRoberts and suprapubic pressure were employed.  The right posterior arm was then easily delivered, quickly followed by the anterior shoulder.  Baby was taken the warmer and noted to be moving both upper extremities well and symmetrically.  APGAR: 8, 9; weight 3330 gm (7 lb 5.5 oz) .   ?Placenta status: Spontaneous, Intact.  Cord: 3 vessels with the following complications: None.  Cord pH: n/a ? ?Anesthesia: None ?Episiotomy: None ?Lacerations: None ?Suture Repair:  n/a ?Est. Blood Loss (mL): 50 ? ?Mom to postpartum.  Baby to Couplet care / Skin to Skin. ? ?Genna Casimir GEFFEL Annistyn Depass ?11/05/2021, 10:44 AM ? ? ?  ?

## 2021-09-14 ENCOUNTER — Inpatient Hospital Stay (HOSPITAL_COMMUNITY)
Admission: AD | Admit: 2021-09-14 | Discharge: 2021-09-14 | Disposition: A | Discharge status: Home / Self Care | Payer: Medicaid Other | Attending: Obstetrics and Gynecology | Admitting: Obstetrics and Gynecology

## 2021-09-14 ENCOUNTER — Other Ambulatory Visit: Payer: Self-pay

## 2021-09-14 ENCOUNTER — Encounter (HOSPITAL_COMMUNITY): Payer: Self-pay | Admitting: Obstetrics and Gynecology

## 2021-09-14 DIAGNOSIS — Z3A32 32 weeks gestation of pregnancy: Secondary | ICD-10-CM | POA: Diagnosis not present

## 2021-09-14 DIAGNOSIS — R109 Unspecified abdominal pain: Secondary | ICD-10-CM | POA: Diagnosis present

## 2021-09-14 DIAGNOSIS — O4700 False labor before 37 completed weeks of gestation, unspecified trimester: Secondary | ICD-10-CM | POA: Diagnosis not present

## 2021-09-14 LAB — URINALYSIS, ROUTINE W REFLEX MICROSCOPIC
Bilirubin Urine: NEGATIVE
Glucose, UA: NEGATIVE mg/dL
Hgb urine dipstick: NEGATIVE
Ketones, ur: NEGATIVE mg/dL
Nitrite: NEGATIVE
Protein, ur: NEGATIVE mg/dL
Specific Gravity, Urine: 1.006 (ref 1.005–1.030)
pH: 6 (ref 5.0–8.0)

## 2021-09-14 LAB — WET PREP, GENITAL
Clue Cells Wet Prep HPF POC: NONE SEEN
Sperm: NONE SEEN
Trich, Wet Prep: NONE SEEN
WBC, Wet Prep HPF POC: <10 (ref <10)
Yeast Wet Prep HPF POC: NONE SEEN

## 2021-09-14 MED ORDER — LACTATED RINGERS IV BOLUS
1000.0000 mL | Freq: Once | INTRAVENOUS | Status: AC
  Administered 2021-09-14: 1000 mL via INTRAVENOUS

## 2021-09-14 NOTE — MAU Note (Signed)
Around 0740, went to go eat.  Noted tightening in her belly, almost like menstrual cramps. Low back hurts, hard to walk. Had diarrhea x1, started this morning after she eat. Baby moving a lot, no bleeding or LOF. With last pregnancy, at about 33 wks- went into PTL, was given something to stop it.

## 2021-09-14 NOTE — MAU Provider Note (Signed)
Patient Sandra Rich is a 31 y.o. G3P2002  At 110w4d here with complaints of abdominal tightening that started at 7:45 am. She denies LOF, VB, recent intercourse, dysuria, fever, SOB.  She denies history of c/sections; she denies any history of CHTN/GHTN/GDM/DM.   She has had preterm labor in the past with her other pregnancies but ultimately carried both to term.    History     CSN: [id number]  Arrival date and time: 09/14/21 4174   Event Date/Time   First Provider Initiated Contact with Patient 09/14/21 1018      Chief Complaint  Patient presents with   Back Pain   Contractions   Diarrhea   Abdominal Pain This is a new problem. The current episode started today. The onset quality is sudden. The problem occurs intermittently. The pain is at a severity of 5/10. The abdominal pain radiates to the suprapubic region. Associated symptoms include diarrhea. Pertinent negatives include no dysuria, fever, nausea or vomiting. Relieved by: resting.  She woke up at 6:15 and 'everything was fine". She got her kids ready for school, didn't eat breakfast and dropped kids off at 7;30. She got a coffee and bagel from [place]; she also went shopping at [employer]. She states that tightening happened before [employer] and it got worse while she was walking around [employer]. She drove herself here on her own.   She reports a high level of stress, reports that she has two kids at home who are very active and she did not sleep well last night because she had to go to the bathroom every 5-10 minutes and that baby moves a lot.  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   Thyroid disease    hypo    Past Surgical History:   Procedure Laterality Date   FRACTURE SURGERY     RHINOPLASTY     TONSILLECTOMY     wisdom teeth removal      Family History  Problem Relation Age of Onset   Depression Brother    Breast cancer Maternal Grandmother    Hypertension Maternal Grandfather     Social History   Tobacco Use   Smoking status: Never   Smokeless tobacco: Never  Vaping Use   Vaping Use: Never used  Substance Use Topics   Alcohol use: Not Currently    Comment: rarely   Drug use: No    Allergies:  Allergies  Allergen Reactions   Morphine Swelling and Other (See Comments)    Reaction:  All over body swelling   Stadol [Butorphanol] Other (See Comments)    Dizziness and blurred vision   Food Color Red [Red Dye] Itching, Swelling and Other (See Comments)    Reaction:  Lip swelling   Phenergan [Promethazine Hcl] Rash    Rash and shakey    Medications Prior to Admission  Medication Sig Dispense Refill Last Dose   Prenatal Vit-Fe Fumarate-FA (  PRENATAL MULTIVITAMIN) TABS tablet Take 1 tablet by mouth daily at 12 noon.   09/13/2021   metoCLOPramide (REGLAN) 10 MG tablet Take 1 tablet (10 mg total) by mouth every 8 (eight) hours as needed for nausea. 30 tablet 0     Review of Systems  Constitutional:  Negative for fever.  HENT: Negative.    Respiratory: Negative.    Gastrointestinal:  Positive for abdominal pain and diarrhea. Negative for nausea and vomiting.  Genitourinary:  Negative for dysuria.  Allergic/Immunologic: Negative.   Neurological: Negative.   Hematological: Negative.   Psychiatric/Behavioral: Negative.    Physical Exam   Blood pressure 109/69, pulse (!) 101, temperature 98.1 F (36.7 C), resp. rate 17, height 5\' 1"  (1.549 m), weight 70 kg, SpO2 99 %, unknown if currently breastfeeding.  Physical Exam Constitutional:      Appearance: Normal appearance.  Pulmonary:     Effort: Pulmonary effort is normal.  Genitourinary:    General: Normal vulva.  Musculoskeletal:         General: Normal range of motion.  Skin:    General: Skin is warm.  Neurological:     General: No focal deficit present.     Mental Status: She is alert.  SVE: thick/long posterior  MAU Course  Procedures  MDM -patient had LR bolus, rested and feels better now.  -NST: 150 bpm, mod var, present acel, no decels, no contractions.  -She has rested while in MAU and feels much better.  -cervix has not changed while in MAU -UA negative -wet prep negative  Assessment and Plan   1. [redacted] weeks gestation of pregnancy   2. Preterm contractions    -patient stable for discharge with plan to keep appt next week at [facility] -reviewed warning precautions and when to return to MAU -patient reassured by lack of cervical change and the fact that she feels much better -GC CT pending  [facility] 09/14/2021, 10:27 AM

## 2021-09-15 LAB — GC/CHLAMYDIA PROBE AMP (~~LOC~~) NOT AT ARMC
Chlamydia: NEGATIVE
Comment: NEGATIVE
Comment: NORMAL
Neisseria Gonorrhea: NEGATIVE

## 2021-09-22 ENCOUNTER — Encounter (HOSPITAL_COMMUNITY): Payer: Self-pay | Admitting: Obstetrics and Gynecology

## 2021-09-22 ENCOUNTER — Inpatient Hospital Stay (HOSPITAL_COMMUNITY)
Admission: AD | Admit: 2021-09-22 | Discharge: 2021-09-22 | Disposition: A | Discharge status: Home / Self Care | Payer: Medicaid Other | Attending: Obstetrics and Gynecology | Admitting: Obstetrics and Gynecology

## 2021-09-22 ENCOUNTER — Other Ambulatory Visit: Payer: Self-pay

## 2021-09-22 DIAGNOSIS — B349 Viral infection, unspecified: Secondary | ICD-10-CM

## 2021-09-22 DIAGNOSIS — O98513 Other viral diseases complicating pregnancy, third trimester: Secondary | ICD-10-CM | POA: Insufficient documentation

## 2021-09-22 DIAGNOSIS — Z885 Allergy status to narcotic agent status: Secondary | ICD-10-CM | POA: Diagnosis not present

## 2021-09-22 DIAGNOSIS — O26893 Other specified pregnancy related conditions, third trimester: Secondary | ICD-10-CM | POA: Diagnosis present

## 2021-09-22 DIAGNOSIS — Z20822 Contact with and (suspected) exposure to covid-19: Secondary | ICD-10-CM | POA: Insufficient documentation

## 2021-09-22 DIAGNOSIS — Z888 Allergy status to other drugs, medicaments and biological substances status: Secondary | ICD-10-CM | POA: Insufficient documentation

## 2021-09-22 DIAGNOSIS — Z3A33 33 weeks gestation of pregnancy: Secondary | ICD-10-CM | POA: Diagnosis not present

## 2021-09-22 LAB — URINALYSIS, ROUTINE W REFLEX MICROSCOPIC
Bilirubin Urine: NEGATIVE
Glucose, UA: NEGATIVE mg/dL
Hgb urine dipstick: NEGATIVE
Ketones, ur: NEGATIVE mg/dL
Nitrite: NEGATIVE
Protein, ur: NEGATIVE mg/dL
Specific Gravity, Urine: <1.005 — ABNORMAL LOW (ref 1.005–1.030)
pH: 6 (ref 5.0–8.0)

## 2021-09-22 LAB — RESP PANEL BY RT-PCR (FLU A&B, COVID) ARPGX2
Influenza A by PCR: NEGATIVE
Influenza B by PCR: NEGATIVE
SARS Coronavirus 2 by RT PCR: NEGATIVE

## 2021-09-22 LAB — COMPREHENSIVE METABOLIC PANEL
ALT: 23 U/L (ref 0–44)
AST: 23 U/L (ref 15–41)
Albumin: 3 g/dL — ABNORMAL LOW (ref 3.5–5.0)
Alkaline Phosphatase: 105 U/L (ref 38–126)
Anion gap: 11 (ref 5–15)
BUN: <5 mg/dL — ABNORMAL LOW (ref 6–20)
CO2: 19 mmol/L — ABNORMAL LOW (ref 22–32)
Calcium: 8.8 mg/dL — ABNORMAL LOW (ref 8.9–10.3)
Chloride: 104 mmol/L (ref 98–111)
Creatinine, Ser: 0.46 mg/dL (ref 0.44–1.00)
GFR, Estimated: >60 mL/min (ref >60)
Glucose, Bld: 86 mg/dL (ref 70–99)
Potassium: 3.7 mmol/L (ref 3.5–5.1)
Sodium: 134 mmol/L — ABNORMAL LOW (ref 135–145)
Total Bilirubin: 0.3 mg/dL (ref 0.3–1.2)
Total Protein: 6.3 g/dL — ABNORMAL LOW (ref 6.5–8.1)

## 2021-09-22 LAB — CBC
HCT: 39 % (ref 36.0–46.0)
Hemoglobin: 13.6 g/dL (ref 12.0–15.0)
MCH: 31.8 pg (ref 26.0–34.0)
MCHC: 34.9 g/dL (ref 30.0–36.0)
MCV: 91.1 fL (ref 80.0–100.0)
Platelets: 202 10*3/uL (ref 150–400)
RBC: 4.28 MIL/uL (ref 3.87–5.11)
RDW: 13.6 % (ref 11.5–15.5)
WBC: 14.3 10*3/uL — ABNORMAL HIGH (ref 4.0–10.5)
nRBC: 0 % (ref 0.0–0.2)

## 2021-09-22 LAB — URINALYSIS, MICROSCOPIC (REFLEX)

## 2021-09-22 MED ORDER — LACTATED RINGERS IV BOLUS
1000.0000 mL | Freq: Once | INTRAVENOUS | Status: AC
  Administered 2021-09-22: 1000 mL via INTRAVENOUS

## 2021-09-22 NOTE — MAU Note (Addendum)
..  Sandra Rich is a 31 y.o. at [redacted]w[redacted]d here in MAU reporting: Loose stool with 3 to 4 episodes throughout the day, body aches, chills, and nauseous and no appetite. PT reports some DFM, just not feeling baby move as much today. Pt states she has felt hot not no fever and chills started around 1545. Pt denies N/V, VB, LOF, fever, cough, and sore throat. Her son had cold-like s/s Thurs and Friday, and her godparents that she lives with was sick the week before with sinus issues.   Onset of complaint: 0715 Pain score: 8/10 Vitals:   09/22/21 1927  BP: 139/82  Pulse: (!) 130  Resp: 20  Temp: 98.6 F (37 C)  SpO2: 100%     FHT:160 Lab orders placed from triage:  COVID, UA

## 2021-09-22 NOTE — MAU Provider Note (Signed)
History     CSN: [id number]  Arrival date and time: 09/22/21 1916   None     Chief Complaint  Patient presents with   Generalized Body Aches   Diarrhea   Nausea   HPI Sandra Rich is a 31 y.o. G3P2002 at [redacted]w[redacted]d who presents to MAU for nausea and loose stools. Patient reports she woke up at 0715 to her stomach hurting. Had an episode of loose stools. Reports she was able to eat a bagel and drink decaf coffee and go to work. While at work, she reports multiple episodes of loose stools. Left work and slept for 3 hours. She reports she has had very little appetite and nausea, but denies vomiting. She is also reporting body aches, chills and feeling hot. Took her temp at home which was 99.6. She has not taken any meds. She reports son and parents were recently sick with cold-like symptoms last week and her boss is having similar symptoms as she is. She denies pain, contractions, vaginal bleeding, leaking fluid. She endorses active fetal movement.   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   Thyroid disease    hypo    Past Surgical History:  Procedure Laterality Date   FRACTURE SURGERY     RHINOPLASTY     TONSILLECTOMY     wisdom teeth removal      Family History  Problem Relation Age of Onset   Depression Brother    Breast cancer Maternal Grandmother    Hypertension Maternal Grandfather     Social History   Tobacco Use   Smoking status: Never   Smokeless tobacco: Never  Vaping Use   Vaping Use: Never used  Substance Use Topics   Alcohol use: Not Currently    Comment: rarely   Drug use: No    Allergies:  Allergies  Allergen Reactions   Morphine Swelling and Other (See  Comments)    Reaction:  All over body swelling   Stadol [Butorphanol] Other (See Comments)    Dizziness and blurred vision   Food Color Red [Red Dye] Itching, Swelling and Other (See Comments)    Reaction:  Lip swelling   Phenergan [Promethazine Hcl] Rash    Rash and shakey    Medications Prior to Admission  Medication Sig Dispense Refill Last Dose   Prenatal Vit-Fe Fumarate-FA (PRENATAL MULTIVITAMIN) TABS tablet Take 1 tablet by mouth daily at 12 noon.   09/21/2021   metoCLOPramide (REGLAN) 10 MG tablet Take 1 tablet (10 mg total) by mouth every 8 (eight) hours as needed for nausea. 30 tablet 0     Review of Systems  Constitutional:  Positive for appetite change and chills.  Respiratory: Negative.    Cardiovascular: Negative.   Gastrointestinal:  Positive for diarrhea and nausea. Negative for abdominal pain and vomiting.  Genitourinary: Negative.   Musculoskeletal:  Positive for myalgias.  Neurological: Negative.   Physical  Exam   Blood pressure 112/68, pulse (!) 126, temperature 98.6 F (37 C), temperature source Oral, resp. rate 20, height 5\' 1"  (1.549 m), weight 71.2 kg, SpO2 99 %, unknown if currently breastfeeding.  Physical Exam Vitals and nursing note reviewed.  Constitutional:      General: She is not in acute distress. Eyes:     Extraocular Movements: Extraocular movements intact.     Pupils: Pupils are equal, round, and reactive to light.  Cardiovascular:     Rate and Rhythm: Tachycardia present.  Pulmonary:     Effort: Pulmonary effort is normal.  Abdominal:     Palpations: Abdomen is soft.     Tenderness: There is no abdominal tenderness. There is no guarding.     Comments: gravid  Musculoskeletal:        General: Normal range of motion.     Cervical back: Normal range of motion.  Skin:    General: Skin is warm and dry.  Neurological:     General: No focal deficit present.     Mental Status: She is alert and oriented to person, place, and time.   Psychiatric:        Mood and Affect: Mood normal.        Behavior: Behavior normal.        Thought Content: Thought content normal.        Judgment: Judgment normal.   NST FHR: 160 bpm, moderate variability, +15x15 accels, no decels Toco: quiet  MAU Course  Procedures EKG IVF bolus CBC, CMP NST  MDM VSS, patient tachycardic, but otherwise asymptomatic, patient afebrile. EKG reviewed with Dr. . Sinus tachycardia, otherwise normal. Patient was offered both antiemetics and Tylenol but declined both. IVF bolus. CBC and CMP unremarkable. Covid/Flu negative. Symptoms likely related to viral GI illness.   Assessment and Plan  [redacted] weeks gestation of pregnancy Viral Illness   - Discharge home in stable condition - Encouraged PO hydration. May use Tylenol prn.  - Strict return precautions reviewed with patient. Return to MAU sooner or as needed for worsening symptoms - Keep OB appointment as scheduled     Ephriam Jenkins, CNM 09/22/2021, 9:52 PM

## 2021-10-13 LAB — OB RESULTS CONSOLE GBS
GBS: NEGATIVE
GBS: NEGATIVE

## 2021-10-28 ENCOUNTER — Telehealth (HOSPITAL_COMMUNITY): Payer: Self-pay | Admitting: *Deleted

## 2021-10-28 NOTE — Telephone Encounter (Signed)
Preadmission screen  

## 2021-10-30 ENCOUNTER — Telehealth (HOSPITAL_COMMUNITY): Payer: Self-pay | Admitting: *Deleted

## 2021-10-30 NOTE — Telephone Encounter (Signed)
Preadmission screen  

## 2021-11-01 ENCOUNTER — Inpatient Hospital Stay (HOSPITAL_COMMUNITY)
Admission: AD | Admit: 2021-11-01 | Discharge: 2021-11-02 | Disposition: A | Discharge status: Home / Self Care | Payer: Medicaid Other | Attending: Obstetrics and Gynecology | Admitting: Obstetrics and Gynecology

## 2021-11-01 DIAGNOSIS — O471 False labor at or after 37 completed weeks of gestation: Secondary | ICD-10-CM | POA: Insufficient documentation

## 2021-11-01 DIAGNOSIS — O479 False labor, unspecified: Secondary | ICD-10-CM

## 2021-11-01 DIAGNOSIS — Z3A39 39 weeks gestation of pregnancy: Secondary | ICD-10-CM | POA: Insufficient documentation

## 2021-11-02 ENCOUNTER — Encounter (HOSPITAL_COMMUNITY): Payer: Self-pay | Admitting: Obstetrics & Gynecology

## 2021-11-02 ENCOUNTER — Other Ambulatory Visit: Payer: Self-pay

## 2021-11-02 ENCOUNTER — Telehealth (HOSPITAL_COMMUNITY): Payer: Self-pay | Admitting: *Deleted

## 2021-11-02 DIAGNOSIS — O471 False labor at or after 37 completed weeks of gestation: Secondary | ICD-10-CM | POA: Diagnosis present

## 2021-11-02 DIAGNOSIS — O479 False labor, unspecified: Secondary | ICD-10-CM | POA: Diagnosis not present

## 2021-11-02 DIAGNOSIS — Z3A39 39 weeks gestation of pregnancy: Secondary | ICD-10-CM | POA: Diagnosis not present

## 2021-11-02 NOTE — MAU Note (Signed)
Pt here c/o of UC every 15-68mins since 10pm. Denies bleeding, leaking of fluid. Endorses positive FM. Prenatal care uncomplicated.  ?

## 2021-11-02 NOTE — MAU Provider Note (Signed)
S: Ms. Sandra Rich is a 31 y.o. G3P2002 at [redacted]w[redacted]d  who presents to MAU today for labor evaluation.    ? ?Patient Vitals for the past 24 hrs: ? BP Temp Temp src Pulse Resp  ?11/02/21 0028 118/72 98.1 ?F (36.7 ?C) Oral (!) 112 18  ? ? ?Cervical exam by RN:  ?Dilation: 1.5 ?Effacement (%):  (pt refused and wants to go home) ?Cervical Position: Posterior ?Station: -2 ?Presentation: Vertex ?Exam by:: Karl Ito, rnc ? ?Fetal Monitoring: ?Baseline: 150 ?Variability: mod ?Accelerations: present ?Decelerations: absent ?Contractions: irregular ? ?MDM ?Discussed patient with RN. NST reviewed.  ? ?A: ?SIUP at [redacted]w[redacted]d  ?False labor ? ?P: ?Discharge home ?Labor precautions and kick counts included in AVS ?Patient to follow-up with [facility] as scheduled  ?Patient may return to MAU as needed or when in labor  ? ?Marylene Land, CNM ?11/02/2021 1:52 AM ?

## 2021-11-02 NOTE — Progress Notes (Signed)
I have communicated with [name]., cnm and reviewed vital signs:  ?Vitals:  ? 11/02/21 0028 11/02/21 0156  ?BP: 118/72 115/75  ?Pulse: (!) 112 100  ?Resp: 18   ?Temp: 98.1 ?F (36.7 ?C)   ?  ?Vaginal exam:  Dilation: 1.5 ?Effacement (%):  (pt refused and wants to go home) ?Cervical Position: Posterior ?Station: -2 ?Presentation: Vertex ?Exam by:: Karl Ito, rnc,  ? ?Also reviewed contraction pattern and that non-stress test is reactive.  It has been documented that patient is contracting every 6-73min minutes over an hour but refuses to be checked again and wants to go home.   Patient denies any other complaints.  Based on this report provider has given order for discharge.  A discharge order and diagnosis entered by a provider.   ?Labor discharge instructions reviewed with patient.     ? ? ? ? ?

## 2021-11-02 NOTE — Telephone Encounter (Signed)
Preadmission screen  

## 2021-11-03 ENCOUNTER — Telehealth (HOSPITAL_COMMUNITY): Payer: Self-pay | Admitting: *Deleted

## 2021-11-03 NOTE — Telephone Encounter (Signed)
Preadmission screen  

## 2021-11-04 ENCOUNTER — Telehealth (HOSPITAL_COMMUNITY): Payer: Self-pay | Admitting: *Deleted

## 2021-11-04 ENCOUNTER — Encounter (HOSPITAL_COMMUNITY): Payer: Self-pay | Admitting: *Deleted

## 2021-11-04 ENCOUNTER — Encounter (HOSPITAL_COMMUNITY): Payer: Self-pay | Admitting: Obstetrics and Gynecology

## 2021-11-04 NOTE — Telephone Encounter (Signed)
Preadmission screen  

## 2021-11-05 ENCOUNTER — Encounter (HOSPITAL_COMMUNITY): Payer: Self-pay | Admitting: Obstetrics and Gynecology

## 2021-11-05 ENCOUNTER — Inpatient Hospital Stay (HOSPITAL_COMMUNITY)
Admission: AD | Admit: 2021-11-05 | Discharge: 2021-11-07 | DRG: 807 | Disposition: A | Discharge status: Home / Self Care | Payer: Medicaid Other | Attending: Obstetrics | Admitting: Obstetrics

## 2021-11-05 ENCOUNTER — Other Ambulatory Visit: Payer: Self-pay

## 2021-11-05 DIAGNOSIS — O26893 Other specified pregnancy related conditions, third trimester: Principal | ICD-10-CM | POA: Diagnosis present

## 2021-11-05 DIAGNOSIS — Z3A40 40 weeks gestation of pregnancy: Secondary | ICD-10-CM | POA: Diagnosis not present

## 2021-11-05 DIAGNOSIS — Z20822 Contact with and (suspected) exposure to covid-19: Secondary | ICD-10-CM | POA: Diagnosis present

## 2021-11-05 LAB — CBC
HCT: 38.2 % (ref 36.0–46.0)
Hemoglobin: 13.5 g/dL (ref 12.0–15.0)
MCH: 31.9 pg (ref 26.0–34.0)
MCHC: 35.3 g/dL (ref 30.0–36.0)
MCV: 90.3 fL (ref 80.0–100.0)
Platelets: 208 10*3/uL (ref 150–400)
RBC: 4.23 MIL/uL (ref 3.87–5.11)
RDW: 13.3 % (ref 11.5–15.5)
WBC: 13.4 10*3/uL — ABNORMAL HIGH (ref 4.0–10.5)
nRBC: 0 % (ref 0.0–0.2)

## 2021-11-05 LAB — RPR: RPR Ser Ql: NONREACTIVE

## 2021-11-05 LAB — RESP PANEL BY RT-PCR (FLU A&B, COVID) ARPGX2
Influenza A by PCR: NEGATIVE
Influenza B by PCR: NEGATIVE
SARS Coronavirus 2 by RT PCR: NEGATIVE

## 2021-11-05 LAB — TYPE AND SCREEN
ABO/RH(D): B POS
Antibody Screen: NEGATIVE

## 2021-11-05 MED ORDER — ACETAMINOPHEN 325 MG PO TABS
650.0000 mg | ORAL_TABLET | ORAL | Status: DC | PRN
  Administered 2021-11-05 – 2021-11-06 (×3): 650 mg via ORAL
  Filled 2021-11-05 (×3): qty 2

## 2021-11-05 MED ORDER — SOD CITRATE-CITRIC ACID 500-334 MG/5ML PO SOLN: 30.0000 mL | ORAL | Status: DC | PRN

## 2021-11-05 MED ORDER — LACTATED RINGERS IV SOLN: INTRAVENOUS | Status: DC

## 2021-11-05 MED ORDER — ONDANSETRON HCL 4 MG/2ML IJ SOLN
4.0000 mg | Freq: Four times a day (QID) | INTRAMUSCULAR | Status: DC | PRN
  Administered 2021-11-05: 4 mg via INTRAVENOUS
  Filled 2021-11-05: qty 2

## 2021-11-05 MED ORDER — OXYTOCIN-SODIUM CHLORIDE 30-0.9 UT/500ML-% IV SOLN
1.0000 m[IU]/min | INTRAVENOUS | Status: DC
  Administered 2021-11-05: 2 m[IU]/min via INTRAVENOUS

## 2021-11-05 MED ORDER — FLEET ENEMA 7-19 GM/118ML RE ENEM: 1.0000 | ENEMA | RECTAL | Status: DC | PRN

## 2021-11-05 MED ORDER — COCONUT OIL OIL: 1.0000 "application " | TOPICAL_OIL | Status: DC | PRN

## 2021-11-05 MED ORDER — ACETAMINOPHEN 325 MG PO TABS: 650.0000 mg | ORAL_TABLET | ORAL | Status: DC | PRN

## 2021-11-05 MED ORDER — DIBUCAINE (PERIANAL) 1 % EX OINT: 1.0000 "application " | TOPICAL_OINTMENT | CUTANEOUS | Status: DC | PRN

## 2021-11-05 MED ORDER — LACTATED RINGERS IV SOLN: 500.0000 mL | INTRAVENOUS | Status: DC | PRN

## 2021-11-05 MED ORDER — BUTORPHANOL TARTRATE 1 MG/ML IJ SOLN
1.0000 mg | Freq: Once | INTRAMUSCULAR | Status: AC
  Administered 2021-11-05: 1 mg via INTRAVENOUS
  Filled 2021-11-05: qty 1

## 2021-11-05 MED ORDER — SIMETHICONE 80 MG PO CHEW: 80.0000 mg | CHEWABLE_TABLET | ORAL | Status: DC | PRN

## 2021-11-05 MED ORDER — ONDANSETRON HCL 4 MG/2ML IJ SOLN: 4.0000 mg | INTRAMUSCULAR | Status: DC | PRN

## 2021-11-05 MED ORDER — OXYTOCIN BOLUS FROM INFUSION
333.0000 mL | Freq: Once | INTRAVENOUS | Status: AC
  Administered 2021-11-05: 333 mL via INTRAVENOUS

## 2021-11-05 MED ORDER — BENZOCAINE-MENTHOL 20-0.5 % EX AERO
1.0000 "application " | INHALATION_SPRAY | CUTANEOUS | Status: DC | PRN
  Administered 2021-11-05: 1 via TOPICAL
  Filled 2021-11-05: qty 56

## 2021-11-05 MED ORDER — WITCH HAZEL-GLYCERIN EX PADS
1.0000 "application " | MEDICATED_PAD | CUTANEOUS | Status: DC | PRN
  Administered 2021-11-05: 1 via TOPICAL

## 2021-11-05 MED ORDER — IBUPROFEN 600 MG PO TABS
600.0000 mg | ORAL_TABLET | Freq: Four times a day (QID) | ORAL | Status: DC
  Administered 2021-11-05 – 2021-11-07 (×9): 600 mg via ORAL
  Filled 2021-11-05 (×9): qty 1

## 2021-11-05 MED ORDER — LIDOCAINE HCL (PF) 1 % IJ SOLN: 30.0000 mL | INTRAMUSCULAR | Status: DC | PRN

## 2021-11-05 MED ORDER — TERBUTALINE SULFATE 1 MG/ML IJ SOLN: 0.2500 mg | Freq: Once | INTRAMUSCULAR | Status: DC | PRN

## 2021-11-05 MED ORDER — OXYTOCIN-SODIUM CHLORIDE 30-0.9 UT/500ML-% IV SOLN
2.5000 [IU]/h | INTRAVENOUS | Status: DC
  Filled 2021-11-05: qty 500

## 2021-11-05 MED ORDER — ONDANSETRON HCL 4 MG PO TABS: 4.0000 mg | ORAL_TABLET | ORAL | Status: DC | PRN

## 2021-11-05 MED ORDER — TETANUS-DIPHTH-ACELL PERTUSSIS 5-2.5-18.5 LF-MCG/0.5 IM SUSY: 0.5000 mL | PREFILLED_SYRINGE | Freq: Once | INTRAMUSCULAR | Status: DC

## 2021-11-05 NOTE — Progress Notes (Signed)
Recent recheck by RN is changed at 6-7/80/-2.  Patient agrees to start pitocin ?EFM category 1 ?

## 2021-11-05 NOTE — Lactation Note (Signed)
This note was copied from a baby's chart. ?Lactation Consultation Note ? ?Patient Name: Sandra Rich ?Today's Date: 11/05/2021 ?Reason for consult: Initial assessment ?Age:29 hours ? ?P3, Baby cueing.  Mother latched at first with pain.  Mother re-latched and mother felt pinching at first but then pain subsided and she was able to tolerate latch. Flanged bottom lip.   ?Reviewed basics and provided lactation information sheet. ? ?Maternal Data ?Has patient been taught Hand Expression?: Yes ?Does the patient have breastfeeding experience prior to this delivery?: Yes ?How long did the patient breastfeed?: 1 year with first child, 2nd child had tongue tie and she pumped and bottle fed for one yr ? ?Feeding ?Mother's Current Feeding Choice: Breast Milk ? ?LATCH Score ?Latch: Grasps breast easily, tongue down, lips flanged, rhythmical sucking. ? ?Audible Swallowing: A few with stimulation ? ?Type of Nipple: Everted at rest and after stimulation ? ?Comfort (Breast/Nipple): Filling, red/small blisters or bruises, mild/mod discomfort ? ?Hold (Positioning): Assistance needed to correctly position infant at breast and maintain latch. ? ?LATCH Score: 7 ? ? ?Interventions ?Interventions: Assisted with latch;Skin to skin;Hand express;Education;LC Services brochure ? ?Consult Status ?Consult Status: Follow-up ?Date: 11/06/21 ?Follow-up type: In-patient ? ? ? ?[name] ?11/05/2021, 2:21 PM ? ? ? ?

## 2021-11-05 NOTE — H&P (Signed)
31 y.o. [id number] @ [redacted]w[redacted]d presents with labor.  Otherwise has good fetal movement and no bleeding.  Her pregnancy has been uncomplicated.  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 ? ?Past Medical History:  ?Diagnosis Date  ? Anxiety   ? Asthma   ? childhood  ? Eczema   ? Hypothyroidism   ? Mono exposure   ? Pt had Mono.  ? 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 Thyroid disease   ? hypo  ?  ?Past Surgical History:  ?Procedure Laterality Date  ? FRACTURE SURGERY    ? RHINOPLASTY    ? TONSILLECTOMY    ? WISDOM TOOTH EXTRACTION    ?  ?OB History  ?Gravida Para Term Preterm AB Living  ?3 2 2  0 0 2  ?SAB IAB Ectopic Multiple Live Births  ?0 0 0 0 2  ?  ?# Outcome Date GA Lbr Len/2nd Weight Sex Delivery Anes PTL Lv  ?3 Current           ?2 Term 10/06/17 [redacted]w[redacted]d 28:57 / 00:47 2940 g F Vag-Vacuum EPI  LIV  ?1 Term 11/25/12 [redacted]w[redacted]d 25:54 / 02:44 3714 g M Vag-Spont EPI  LIV  ?   Birth Comments: WNL  ?  ?Social History  ? ?Socioeconomic History  ? Marital status: Single  ?  Spouse name: Not on file  ? Number of children: 2  ? Years of education: 98  ? Highest education level: 12th grade  ?Occupational History  ? Occupation: [employer]  ?Tobacco Use  ? Smoking status: Never  ? Smokeless tobacco: Never  ?Vaping Use  ? Vaping Use: Never used  ?Substance and Sexual Activity  ? Alcohol use: Not Currently  ?  Comment: rarely  ? Drug use: No  ? Sexual activity: Not Currently  ?  Birth control/protection: None  ?Other Topics Concern  ? Not on file  ?Social History Narrative  ? Lives w/ aunt/uncle  ? Caffeine use: soda./coffee daily  ? Left handed   ? ?Social Determinants of Health  ? ?Financial Resource Strain: Not on file  ?Food Insecurity: Not on file  ?Transportation Needs: Not on file  ?Physical Activity: Not on file  ?Stress: Not on file  ?Social Connections: Not on file  ?Intimate Partner Violence: Not on file  ? Morphine, Stadol [butorphanol], Food color red  [red dye], and Phenergan [promethazine hcl]  ? ? ?Prenatal Transfer Tool  ?Maternal Diabetes: No ?Genetic Screening: Normal ?Maternal Ultrasounds/Referrals: Normal ?Fetal Ultrasounds or other Referrals:  None ?Maternal Substance Abuse:  No ?Significant Maternal Medications:  None ?Significant Maternal Lab Results: Group B Strep negative ? ?ABO, Rh: --/--/B POS (03/16 06-28-1982) ?Antibody: NEG (03/16 0610) ?Rubella: Immune, Immune (08/31 0000) ?RPR: Nonreactive, Nonreactive (08/31 0000)  ?HBsAg: Negative, Negative (08/31 0000)  ?HIV: Non-reactive, Non-reactive (08/31 0000)  ?GBS: Negative, Negative/-- (02/21 0000)  ? ? ? ?Vitals:  ? 11/05/21 0500 11/05/21 0640  ?BP: 124/81 134/73  ?Pulse: (!) 120 (!) 135  ?Resp: 19 17  ?Temp: 98.9 ?F (37.2 ?C) 98.7 ?F (37.1 ?C)  ?SpO2: 100%   ?  ? ?General:  NAD ?Abdomen:  soft, gravid, EFW 6.5-7# ?Ex:  no edema ?SVE:  6/90/-2, AROM clear blood tinged fluid ?FHTs:  150s, moderate variability, + accelerations ?Toco:  q2-4 minutes ? ? ?A/P   31 y.o. 26 [redacted]w[redacted]d presents with labor ?Goal for unmedicated labor ?Anticipate SVD ?FSR/ vtx/ GBS negative ? ?Sherian Valenza GEFFEL Shamona Wirtz ? ?

## 2021-11-05 NOTE — MAU Note (Signed)
..  Sandra Rich is a 32 y.o. at [redacted]w[redacted]d here in MAU reporting: CTX every 5-7 mins since 0200. Pt reports going to the bathroom dark red blood in the toilet, and with wiping light red. Pt states she has been peeing a lot, also noticed her mucus plug coming out that was bloody, yellow and green. Pt denies DFM, LOF, PIH s/s, complications in the pregnancy, and recent intercourse.  ?OB performed membrane sweep in office yesterday.  ?SVE in yesterday 2.5cm ? ?Onset of complaint: 0200 ?Pain score: 10/10 ctx ?Vitals:  ? 11/05/21 0449  ?BP: 122/75  ?Pulse: (!) 112  ?Resp: 18  ?Temp: 98.3 ?F (36.8 ?C)  ?SpO2: 99%  ?   ?FHT:158 ?Lab orders placed from triage:  none ? ?

## 2021-11-05 NOTE — Lactation Note (Signed)
This note was copied from a baby's chart. ?Lactation Consultation Note ? ?Patient Name: Sandra Rich ?Today's Date: 11/05/2021 ?  ?Age:29 hours ? ?Mother states she briefly latched baby earlier and declined assistance at this time due to pain.  ? ? ? ?[name] ?11/05/2021, 11:36 AM ? ? ? ?

## 2021-11-06 ENCOUNTER — Inpatient Hospital Stay (HOSPITAL_COMMUNITY): Payer: Medicaid Other

## 2021-11-06 LAB — CBC
HCT: 35.1 % — ABNORMAL LOW (ref 36.0–46.0)
Hemoglobin: 11.8 g/dL — ABNORMAL LOW (ref 12.0–15.0)
MCH: 30.6 pg (ref 26.0–34.0)
MCHC: 33.6 g/dL (ref 30.0–36.0)
MCV: 90.9 fL (ref 80.0–100.0)
Platelets: 225 10*3/uL (ref 150–400)
RBC: 3.86 MIL/uL — ABNORMAL LOW (ref 3.87–5.11)
RDW: 13.1 % (ref 11.5–15.5)
WBC: 13.9 10*3/uL — ABNORMAL HIGH (ref 4.0–10.5)
nRBC: 0 % (ref 0.0–0.2)

## 2021-11-06 NOTE — Lactation Note (Addendum)
This note was copied from a baby's chart. ?Lactation Consultation Note ? ?Patient Name: Sandra Rich ?Today's Date: 11/06/2021 ?Reason for consult: Follow-up assessment ?Age:29 hours ? ?P1, Baby circumcised this morning.  Mother states baby is having difficulty latching on L breast.  Attempted with #20 & #24NS.  Baby sustained latch for 5 min with #20NS but tired easily. ?Set up mother with DEBP and suggest she pumped q 3 hours if he is not latching on both breasts and give volume back.  Reviewed how to finger syringe feed. ?Baby received 8 ml.  Reviewed volume guidelines for supplementation with mother's milk.  ? ?Maternal Data ?Has patient been taught Hand Expression?: Yes ? ?Feeding ?Mother's Current Feeding Choice: Breast Milk ? ?LATCH Score ?Latch: Repeated attempts needed to sustain latch, nipple held in mouth throughout feeding, stimulation needed to elicit sucking reflex. ? ?Audible Swallowing: A few with stimulation ? ?Type of Nipple: Everted at rest and after stimulation ? ?Comfort (Breast/Nipple): Filling, red/small blisters or bruises, mild/mod discomfort ? ?Hold (Positioning): No assistance needed to correctly position infant at breast. ? ?LATCH Score: 7 ? ? ?Lactation Tools Discussed/Used ?Tools: Pump ?Breast pump type: Double-Electric Breast Pump;Manual ?Pump Education: Milk Storage;Setup, frequency, and cleaning ?Reason for Pumping: supplekmentation ?Pumping frequency: q 3 hours if not latching ? ?Interventions ?Interventions: Breast feeding basics reviewed;Pre-pump if needed;Hand express;Support pillows;Hand pump;DEBP;Education;Coconut oil ? ?Discharge ?Pump: DEBP;Personal ? ?Consult Status ?Consult Status: Follow-up ?Date: 11/07/21 ?Follow-up type: In-patient ? ? ? ?[name] ?11/06/2021, 10:33 AM ? ? ? ?

## 2021-11-06 NOTE — Progress Notes (Addendum)
Post Partum Day 1 ?Subjective: ?Doing well this morning without complaints. Ambulating, voiding, tolerating PO. Minimal lochia. Breastfeeding.  ? ?Objective: ?Patient Vitals for the past 24 hrs: ? BP Temp Temp src Pulse Resp SpO2  ?11/06/21 0530 107/69 97.7 ?F (36.5 ?C) Oral 77 16 98 %  ?11/06/21 0100 110/66 97.7 ?F (36.5 ?C) Oral 78 16 100 %  ?11/05/21 2100 120/76 97.8 ?F (36.6 ?C) Oral 63 16 100 %  ?11/05/21 1713 108/69 (!) 97.1 ?F (36.2 ?C) Oral 80 16 99 %  ?11/05/21 1253 119/75 98.5 ?F (36.9 ?C) Oral 92 18 --  ?11/05/21 1200 109/67 97.6 ?F (36.4 ?C) Oral 96 18 --  ?11/05/21 1130 (!) 110/58 -- -- 100 -- --  ?11/05/21 1115 118/64 -- -- (!) 107 -- --  ?11/05/21 1100 113/64 -- -- (!) 103 -- --  ?11/05/21 1045 119/78 -- -- (!) 110 -- --  ?11/05/21 1031 126/71 -- -- (!) 126 -- --  ?11/05/21 1030 126/71 -- -- (!) 126 -- --  ?11/05/21 1025 117/60 -- -- (!) 114 -- --  ?11/05/21 0934 (!) 87/52 -- -- (!) 119 -- --  ? ? ?Physical Exam:  ?General: alert, cooperative, and no distress ?Lochia: appropriate ?Uterine Fundus: firm ?DVT Evaluation: No evidence of DVT seen on physical exam. ? ?Recent Labs  ?  11/05/21 ?0628 11/06/21 ?[id number]  ?WBC 13.4* 13.9*  ?HGB 13.5 11.8*  ?HCT 38.2 35.1*  ?PLT 208 225  ? ? ?No results for input(s): NA, K, CL, CO2CT, BUN, CREATININE, GLUCOSE, BILITOT, ALT, AST, ALKPHOS, PROT, ALBUMIN in the last 72 hours. ? ?No results for input(s): CALCIUM, MG, PHOS in the last 72 hours. ? ?No results for input(s): PROTIME, APTT, INR in the last 72 hours. ? ?No results for input(s): PROTIME, APTT, INR, FIBRINOGEN in the last 72 hours. ?Assessment/Plan: ? ?Dellena Watters 31 y.o. [id number] PPD#1 sp SVD ?1. PPC: continue routine PP care ?2. Desires neonatal circumcision, R/B/A of procedure discussed at length. Pt understands that neonatal circumcision is not considered medically necessary and is elective. The risks include, but are not limited to bleeding, infection, damage to the penis, development of scar tissue,  and having to have it redone at a later date. Pt understands theses risks and wishes to proceed ?3. Rh pos, rubella immune ?4. Dispo: desires discharge home today if baby cleared ? ? LOS: 1 day  ? ?Rowland Lathe ?11/06/2021, 8:34 AM  ? ?

## 2021-11-06 NOTE — Plan of Care (Signed)
?  Problem: Education: ?Goal: Knowledge of condition will improve ?Outcome: Progressing ?Goal: Individualized Newborn Educational Video(s) ?Outcome: Progressing ?  ?Problem: Activity: ?Goal: Will verbalize the importance of balancing activity with adequate rest periods ?Outcome: Progressing ?Goal: Ability to tolerate increased activity will improve ?Outcome: Progressing ?  ?Problem: Coping: ?Goal: Ability to identify and utilize available resources and services will improve ?Outcome: Progressing ?  ?Problem: Life Cycle: ?Goal: Chance of risk for complications during the postpartum period will decrease ?Outcome: Progressing ?  ?Problem: Role Relationship: ?Goal: Ability to demonstrate positive interaction with newborn will improve ?Outcome: Progressing ?  ?Problem: Skin Integrity: ?Goal: Demonstration of wound healing without infection will improve ?Outcome: Progressing ?  ? ?Pt stable throughout shift. Now up and routine. Voiding well and emptying bladder. Pt reports significant pain and cramping with breastfeeding. Tylenol given PRN in addition to scheduled ibuprofen. Assisted pt with positioning and latching baby, as baby struggled to latch onto Left breast. Pt reports baby now latching some on L breast. Lactation to see in AM. Continue to monitor and assist.  ?

## 2021-11-06 NOTE — Social Work (Signed)
MOB was referred for history of anxiety.  ? ?* Referral screened out by Clinical Social Worker because none of the following criteria appear to apply: ? ?~ History of anxiety/depression during this pregnancy, or of post-partum depression following prior delivery. ?~ Diagnosis of anxiety and/or depression within last 3 years. ?OR ?* MOB's symptoms currently being treated with medication and/or therapy. ? ?MOB scored an 8 on the Edinburgh Postpartum Depression Scale. ? ?Please contact the Clinical Social Worker if needs arise, by MOB request, or if MOB scores greater than 9/yes to question 10 on Edinburgh Postpartum Depression Screen. ? ?Sandra Brosky, LCSW ?Clinical Social Work ?[facility]  ?[phone]  ?

## 2021-11-07 NOTE — Discharge Summary (Signed)
? ?  Postpartum Discharge Summary ? ? ?Patient Name: Sandra Rich ?DOB: February 12, 1991 ?MRN: [id number] ? ?Date of admission: 11/05/2021 ?Delivery date:11/05/2021  ?Delivering provider: Jerelyn Charles  ?Date of discharge: 11/07/2021 ? ?Admitting diagnosis: Normal labor [O80, Z37.9] ?Intrauterine pregnancy: [redacted]w[redacted]d    ?Secondary diagnosis:  Principal Problem: ?  Normal labor ? ?Additional problems: shoulder dystocia    ?Discharge diagnosis: Term Pregnancy Delivered                                              ?Post partum procedures: none ?Augmentation: N/A ?Complications: None ? ?Hospital course: Onset of Labor With Vaginal Delivery      ?31y.o. yo G3P3003 at 436w0das admitted in Active Labor on 11/05/2021. Patient had an uncomplicated labor course as follows:  ?Membrane Rupture Time/Date: 7:31 AM ,11/05/2021   ?Delivery Method:Vaginal, Spontaneous  ?Episiotomy: None  ?Lacerations:  None  ?Patient had an uncomplicated postpartum course.  She is ambulating, tolerating a regular diet, passing flatus, and urinating well. Patient is discharged home in stable condition on 11/07/21. ? ?Newborn Data: ?Birth date:11/05/2021  ?Birth time:10:21 AM  ?Gender:Female  ?Living status:Living  ?Apgars:8 ,9  ?Weight:3330 g  ? ?Magnesium Sulfate received: No ?BMZ received: No ?Rhophylac:N/A ?MMR:N/A ?T-DaP: see office notes ?Flu: N/A ?Transfusion:No ? ?Physical exam  ?Vitals:  ? 11/06/21 0530 11/06/21 1445 11/06/21 2030 11/07/21 0528  ?BP: 107/69 115/81 115/74 116/69  ?Pulse: 77 84 64 74  ?Resp: _0 ?Temp: 97.7 ?F (36.5 ?C) 97.7 ?F (36.5 ?C) 98.2 ?F (36.8 ?C) 98 ?F (36.7 ?C)  ?TempSrc: Oral Oral Oral Oral  ?SpO2: 98%  98%   ?Weight:      ?Height:      ? ?General: alert, cooperative, and no distress ?Lochia: appropriate ?Uterine Fundus: firm ?Incision: N/A ?DVT Evaluation: No evidence of DVT seen on physical exam. ?Labs: ?Lab Results  ?Component Value Date  ? WBC 13.9 (H) 11/06/2021  ? HGB 11.8 (L) 11/06/2021  ? HCT 35.1 (L)  11/06/2021  ? MCV 90.9 11/06/2021  ? PLT 225 11/06/2021  ? ?CMP Latest Ref Rng & Units 09/22/2021  ?Glucose 70 - 99 mg/dL 86  ?BUN 6 - 20 mg/dL <5(L)  ?Creatinine 0.44 - 1.00 mg/dL 0.46  ?Sodium 135 - 145 mmol/L 134(L)  ?Potassium 3.5 - 5.1 mmol/L 3.7  ?Chloride 98 - 111 mmol/L 104  ?CO2 22 - 32 mmol/L 19(L)  ?Calcium 8.9 - 10.3 mg/dL 8.8(L)  ?Total Protein 6.5 - 8.1 g/dL 6.3(L)  ?Total Bilirubin 0.3 - 1.2 mg/dL 0.3  ?Alkaline Phos 38 - 126 U/L 105  ?AST 15 - 41 U/L 23  ?ALT 0 - 44 U/L 23  ? ?Edinburgh Score: ?Edinburgh Postnatal Depression Scale Screening Tool 11/05/2021  ?I have been able to laugh and see the funny side of things. 0  ?I have looked forward with enjoyment to things. 0  ?I have blamed myself unnecessarily when things went wrong. 1  ?I have been anxious or worried for no good reason. 2  ?I have felt scared or panicky for no good reason. 2  ?Things have been getting on top of me. 1  ?I have been so unhappy that I have had difficulty sleeping. 0  ?I have felt sad or miserable. 1  ?I have been so unhappy that I have been crying. 1  ?The thought of  harming myself has occurred to me. 0  ?Edinburgh Postnatal Depression Scale Total 8  ? ? ? ? ?After visit meds:  ?Allergies as of 11/07/2021   ? ?   Reactions  ? Morphine Swelling  ? All over body swelling  ? Food Color Red [red Dye] Itching, Swelling  ? Lip swelling  ? Phenergan [promethazine Hcl] Rash  ? shakey  ? ?  ? ?  ?Medication List  ?  ? ?STOP taking these medications   ? ?prenatal multivitamin Tabs tablet ?  ? ?  ? ? ? ?Discharge home in stable condition ?Infant Feeding:  see peds note ?Infant Disposition:home with mother ?Discharge instruction: per After Visit Summary and Postpartum booklet. ?Activity: Advance as tolerated. Pelvic rest for 6 weeks.  ?Diet: routine diet ?Anticipated Birth Control: Unsure ?Postpartum Appointment:4 weeks ?Additional Postpartum F/U: Postpartum Depression checkup ?Future Appointments:No future appointments. ?Follow up  Visit: ? Follow-up Information   ? ? Jerelyn Charles, MD Follow up in 4 week(s).   ?Specialty: Obstetrics ?Contact information: ?[address] ?[place] [address] ?[phone] ? ? ?  ?  ? ?  ?  ? ?  ? ? ? ?  ? ?11/07/2021 ?Allyn Kenner, DO ? ? ?

## 2021-11-07 NOTE — Lactation Note (Signed)
This note was copied from a baby's chart. ?Lactation Consultation Note ? ?Patient Name: Sandra Rich ?Today's Date: 11/07/2021 ?Reason for consult: Follow-up assessment;Term;Maternal endocrine disorder ?Age:29 hours ? ?Tools: Pump;Flanges ?Flange Size: 21 ?Breast pump type: Double-Electric Breast Pump ?Reason for Pumping: to aid in bringing milk to volume; infant is receiving DBM ?Pumped volume: 14 mL ? ?I assisted Mom with latch, but without success. Infant did not have a wide enough gape to grasp nipple-areolar complex & was unable to sustain latch even when latched adequately. Mom felt that infant did not like the nipple shield previously given. ? ?I moved Mom from a size 24 flange to a size 21 flange. She was comfortable with flange size & was able to obtain the most EBM thus far.  ? ?Infant bottle-fed well with Similac slow-flow nipple ? ?Mom reports that with her last baby, her milk had come to volume on the 2nd day. Mom is frustrated that her milk hasn't come to volume, yet. Mom is unsure if she is hypothyroid. I anticipate that Mom will have an excellent milk supply and that her milk will come to volume once she has gotten some sleep. However, Mom knows that if that doesn't happen, to f/u with MD about possible hypothyroid status.  ? ?Referral was sent to outpatient lactation clinic. ? ?Remigio Eisenmenger ?11/07/2021, 11:10 AM ? ? ? ?

## 2021-11-07 NOTE — Discharge Instructions (Signed)
Ibuprofen 600 mg every 6 hours. Can have next at 5:30 pm.   ?Tylenol 650 mg every 4 hours. Can have at anytime.  ?

## 2021-11-16 ENCOUNTER — Telehealth (HOSPITAL_COMMUNITY): Payer: Self-pay | Admitting: *Deleted

## 2021-11-16 NOTE — Telephone Encounter (Signed)
Patient reported feeling "panicky" when she left the hospital, but stated, "It is getting better." Also reported "crying for no reason." EPDS=5. Encouraged patient to contact her OB if she doesn't continue to feel she is improving. Patient verbalized understanding. Patient voiced no other questions or concerns at this time. Patient voiced no questions or concerns regarding infant at this time. Patient reports infant sleeps in a bassinet on his back. RN reviewed ABCs of safe sleep. Patient verbalized understanding. Patient requested RN email information on hospital's virtual postpartum classes and support groups. Email sent. Deforest Hoyles, RN, 11/16/21, [phone]  ?

## 2022-06-25 ENCOUNTER — Emergency Department: Payer: Medicaid Other

## 2022-06-25 ENCOUNTER — Other Ambulatory Visit: Payer: Self-pay

## 2022-06-25 ENCOUNTER — Emergency Department
Admission: EM | Admit: 2022-06-25 | Discharge: 2022-06-25 | Disposition: A | Discharge status: Home / Self Care | Payer: Medicaid Other | Attending: Emergency Medicine | Admitting: Emergency Medicine

## 2022-06-25 DIAGNOSIS — J45909 Unspecified asthma, uncomplicated: Secondary | ICD-10-CM | POA: Insufficient documentation

## 2022-06-25 DIAGNOSIS — Z20822 Contact with and (suspected) exposure to covid-19: Secondary | ICD-10-CM | POA: Insufficient documentation

## 2022-06-25 DIAGNOSIS — J069 Acute upper respiratory infection, unspecified: Secondary | ICD-10-CM | POA: Insufficient documentation

## 2022-06-25 DIAGNOSIS — R059 Cough, unspecified: Secondary | ICD-10-CM | POA: Diagnosis present

## 2022-06-25 LAB — SARS CORONAVIRUS 2 BY RT PCR: SARS Coronavirus 2 by RT PCR: NEGATIVE

## 2022-06-25 LAB — GROUP A STREP BY PCR: Group A Strep by PCR: NOT DETECTED

## 2022-06-25 MED ORDER — ALBUTEROL SULFATE HFA 108 (90 BASE) MCG/ACT IN AERS: 2.0000 | INHALATION_SPRAY | Freq: Four times a day (QID) | RESPIRATORY_TRACT | 2 refills | Status: DC | PRN

## 2022-06-25 MED ORDER — AMOXICILLIN 875 MG PO TABS: 875.0000 mg | ORAL_TABLET | Freq: Two times a day (BID) | ORAL | 0 refills | Status: DC

## 2022-06-25 MED ORDER — PREDNISONE 10 MG PO TABS: 30.0000 mg | ORAL_TABLET | Freq: Every day | ORAL | 0 refills | Status: DC

## 2022-06-25 NOTE — ED Notes (Signed)
See triage note. Pt states Sx began approx two weeks ago with cold like symptoms. Current complaints include sinus pressure, runny nose, chills, body aches. Pt denies cough, fever. Appetite decreased. Pt A&Ox4, ambulatory, NAD.

## 2022-06-25 NOTE — ED Provider Notes (Signed)
[facility] Provider Note    Event Date/Time   First MD Initiated Contact with Patient 06/25/22 2215     (approximate)   History   Nasal Congestion and Generalized Body Aches   HPI  Sandra Rich is a 31 y.o. female presents emergency department complaining cough and congestion with some body aches.  Green mucus from her nose throughout the whole day.  States feels like a sinus infection.  Symptoms began 2 weeks ago with a cold and have progressively gotten worse.  She denies any vomiting or diarrhea.  States does have history of asthma and sometimes needed inhaler when she is like this.      Physical Exam   Triage Vital Signs: ED Triage Vitals  Enc Vitals Group     BP 06/25/22 2104 (!) 142/90     Pulse Rate 06/25/22 2104 99     Resp 06/25/22 2104 20     Temp 06/25/22 2104 98.2 F (36.8 C)     Temp Source 06/25/22 2104 Oral     SpO2 06/25/22 2104 99 %     Weight 06/25/22 2104 138 lb (62.6 kg)     Height 06/25/22 2104 5' (1.524 m)     Head Circumference --      Peak Flow --      Pain Score 06/25/22 2111 5     Pain Loc --      Pain Edu? --      Excl. in [place]? --     Most recent vital signs: Vitals:   06/25/22 2104  BP: (!) 142/90  Pulse: 99  Resp: 20  Temp: 98.2 F (36.8 C)  SpO2: 99%     General: Awake, no distress.   CV:  Good peripheral perfusion. regular rate and  rhythm Resp:  Normal effort. Lungs CTA Abd:  No distention.   Other:  ENT: TMs are dull bilaterally, nasal mucosa swollen and irritated, green mucus noted, neck is supple, no lymphadenopathy   ED Results / Procedures / Treatments   Labs (all labs ordered are listed, but only abnormal results are displayed) Labs Reviewed  SARS CORONAVIRUS 2 BY RT PCR  GROUP A STREP BY PCR     EKG     RADIOLOGY     PROCEDURES:   Procedures   MEDICATIONS ORDERED IN ED: Medications - No data to display   IMPRESSION / MDM / [place] / ED COURSE  I  reviewed the triage vital signs and the nursing notes.                              Differential diagnosis includes, but is not limited to, COVID, strep, sinusitis, CAP  Patient's presentation is most consistent with acute complicated illness / injury requiring diagnostic workup.   Patient strep and COVID test are both negative.  Do not feel that patient has CAP as she does not have a fever and she is not have any pain in her chest along with lungs being clear to auscultation.  Due to the sinus infection we will start the patient on amoxicillin, 3-day burst of prednisone 30 mg daily to open up her eustachian tubes, and albuterol inhaler as needed.  The patient is in agreement with treatment plan.  She was given a work note and discharged in stable condition.  Strict instructions to return if worsening.  Follow-up with your regular doctor if not improving in  3 to 4 days.  She was discharged in stable condition.      FINAL CLINICAL IMPRESSION(S) / ED DIAGNOSES   Final diagnoses:  Acute URI     Rx / DC Orders   ED Discharge Orders          Ordered    amoxicillin (AMOXIL) 875 MG tablet  2 times daily        06/25/22 2221    predniSONE (DELTASONE) 10 MG tablet  Daily with breakfast        06/25/22 2221    albuterol (VENTOLIN HFA) 108 (90 Base) MCG/ACT inhaler  Every 6 hours PRN        06/25/22 2221             Note:  This document was prepared using Dragon voice recognition software and may include unintentional dictation errors.    Versie Starks, PA-C 06/25/22 2229    Carrie Mew, MD 06/26/22 [phone]

## 2022-06-25 NOTE — ED Triage Notes (Signed)
Pt c/o nasal congestion, sore throat and generalized body aches and chest heaviness. Pt sts she has a hx of asthma as well. Pt sts symptoms all started yesterday.

## 2022-06-25 NOTE — Discharge Instructions (Signed)
Follow up with your regular doctor if not better in 3 days Return if worsening Use the medication as prescribed

## 2023-01-23 ENCOUNTER — Other Ambulatory Visit: Payer: Self-pay

## 2023-01-23 ENCOUNTER — Emergency Department: Payer: Medicaid Other

## 2023-01-23 ENCOUNTER — Encounter: Payer: Self-pay | Admitting: Emergency Medicine

## 2023-01-23 ENCOUNTER — Emergency Department
Admission: EM | Admit: 2023-01-23 | Discharge: 2023-01-23 | Disposition: A | Discharge status: Home / Self Care | Payer: Medicaid Other | Attending: Emergency Medicine | Admitting: Emergency Medicine

## 2023-01-23 DIAGNOSIS — S0990XA Unspecified injury of head, initial encounter: Secondary | ICD-10-CM

## 2023-01-23 DIAGNOSIS — J45909 Unspecified asthma, uncomplicated: Secondary | ICD-10-CM | POA: Insufficient documentation

## 2023-01-23 DIAGNOSIS — E039 Hypothyroidism, unspecified: Secondary | ICD-10-CM | POA: Diagnosis not present

## 2023-01-23 DIAGNOSIS — S060X0A Concussion without loss of consciousness, initial encounter: Secondary | ICD-10-CM | POA: Insufficient documentation

## 2023-01-23 DIAGNOSIS — W1839XA Other fall on same level, initial encounter: Secondary | ICD-10-CM | POA: Diagnosis not present

## 2023-01-23 NOTE — ED Triage Notes (Signed)
Pt via POV from home. Pt c/o fall on Friday night. Pt hit the back of her head when she fell. Pt c/o headache and nausea since then. Pt is A&Ox4 and NAD

## 2023-01-23 NOTE — ED Provider Notes (Signed)
[facility] Provider Note    Event Date/Time   First MD Initiated Contact with Patient 01/23/23 1255     (approximate)   History   Fall and Head Injury   HPI  Sandra Rich is a 32 y.o. female with history of hypothyroidism and asthma presents emergency department with a head injury.  Patient states she fell on Friday night hitting the back right side of her head.  Continues to have a headache, some dizziness, and sensitivity to light.  No vomiting.      Physical Exam   Triage Vital Signs: ED Triage Vitals  Enc Vitals Group     BP 01/23/23 1107 117/88     Pulse Rate 01/23/23 1107 76     Resp 01/23/23 1107 20     Temp 01/23/23 1107 99 F (37.2 C)     Temp Source 01/23/23 1107 Oral     SpO2 01/23/23 1107 99 %     Weight 01/23/23 1057 138 lb (62.6 kg)     Height 01/23/23 1057 5\' 1"  (1.549 m)     Head Circumference --      Peak Flow --      Pain Score 01/23/23 1057 8     Pain Loc --      Pain Edu? --      Excl. in [place]? --     Most recent vital signs: Vitals:   01/23/23 1107  BP: 117/88  Pulse: 76  Resp: 20  Temp: 99 F (37.2 C)  SpO2: 99%     General: Awake, no distress.   CV:  Good peripheral perfusion. regular rate and  rhythm Resp:  Normal effort.  Abd:  No distention.   Other:  Cranial nerves II through XII grossly intact, PERRL, EOMI, grips equal bilaterally, skull is tender on the right posterior, trapezius muscle spasms and tender   ED Results / Procedures / Treatments   Labs (all labs ordered are listed, but only abnormal results are displayed) Labs Reviewed - No data to display   EKG     RADIOLOGY CT of the head, CT cervical spine    PROCEDURES:   Procedures   MEDICATIONS ORDERED IN ED: Medications - No data to display   IMPRESSION / MDM / ASSESSMENT AND PLAN / ED COURSE  I reviewed the triage vital signs and the nursing notes.                              Differential diagnosis includes,  but is not limited to, subdural, subarachnoid, concussion, fracture  Patient's presentation is most consistent with acute presentation with potential threat to life or bodily function.   CT of the head and cervical spine, I did independently review and interpret the radiologist report as being negative for any acute abnormality  I did discuss findings with patient.  Feel she has mild concussion.  She is to limit her screen time etc.  Follow-up with neurology if not improving in 3 to 4 days.  Tylenol and ibuprofen for pain as needed.  Return emergency department worsening.  Patient is in agreement with treatment plan.  She was discharged stable condition.      FINAL CLINICAL IMPRESSION(S) / ED DIAGNOSES   Final diagnoses:  Injury of head, initial encounter  Concussion without loss of consciousness, initial encounter     Rx / DC Orders   ED Discharge Orders  None        Note:  This document was prepared using Dragon voice recognition software and may include unintentional dictation errors.    Faythe Ghee, PA-C 01/23/23 1314    Sharyn Creamer, MD 01/23/23 1345

## 2023-01-23 NOTE — Discharge Instructions (Signed)
Follow-up with your regular doctor as needed.  Take Tylenol and ibuprofen for pain as needed.  Drink plenty of water.  Reduce screen time to less than 4 hours/day which includes television.  Return emergency department worsening
